# Patient Record
Sex: Female | Born: 1990 | Race: Black or African American | Hispanic: No | Marital: Single | Smoking: Former smoker
Health system: Southern US, Community
[De-identification: ages and names within clinical notes are randomized; demographics above are authoritative.]

## PROBLEM LIST (undated history)

## (undated) ENCOUNTER — Emergency Department (HOSPITAL_COMMUNITY): Admission: EM | Payer: PRIVATE HEALTH INSURANCE | Source: Home / Self Care

---
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
## 2013-12-12 ENCOUNTER — Encounter (HOSPITAL_COMMUNITY): Payer: Self-pay | Admitting: Emergency Medicine

## 2013-12-12 ENCOUNTER — Emergency Department (HOSPITAL_COMMUNITY)
Admission: EM | Admit: 2013-12-12 | Discharge: 2013-12-12 | Disposition: A | Discharge status: Home / Self Care | Payer: Self-pay | Attending: Emergency Medicine | Admitting: Emergency Medicine

## 2013-12-12 ENCOUNTER — Emergency Department (HOSPITAL_COMMUNITY): Payer: Self-pay

## 2013-12-12 DIAGNOSIS — S20219A Contusion of unspecified front wall of thorax, initial encounter: Secondary | ICD-10-CM | POA: Insufficient documentation

## 2013-12-12 DIAGNOSIS — R0789 Other chest pain: Secondary | ICD-10-CM

## 2013-12-12 DIAGNOSIS — T148XXA Other injury of unspecified body region, initial encounter: Secondary | ICD-10-CM

## 2013-12-12 DIAGNOSIS — F17200 Nicotine dependence, unspecified, uncomplicated: Secondary | ICD-10-CM | POA: Insufficient documentation

## 2013-12-12 MED ORDER — IBUPROFEN 600 MG PO TABS: 600.0000 mg | ORAL_TABLET | Freq: Four times a day (QID) | ORAL | Status: DC | PRN

## 2013-12-12 MED ORDER — HYDROCODONE-ACETAMINOPHEN 5-325 MG PO TABS: 1.0000 | ORAL_TABLET | Freq: Four times a day (QID) | ORAL | Status: DC | PRN

## 2013-12-12 MED ORDER — KETOROLAC TROMETHAMINE 60 MG/2ML IM SOLN
60.0000 mg | Freq: Once | INTRAMUSCULAR | Status: AC
  Administered 2013-12-12: 60 mg via INTRAMUSCULAR
  Filled 2013-12-12: qty 2
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
## 2013-12-12 NOTE — Discharge Instructions (Signed)
Chest Wall Pain Chest wall pain is pain felt in or around the chest bones and muscles. It may take up to 6 weeks to get better. It may take longer if you are active. Chest wall pain can happen on its own. Other times, things like germs, injury, coughing, or exercise can cause the pain. HOME CARE   Avoid activities that make you tired or cause pain. Try not to use your chest, belly (abdominal), or side muscles. Do not use heavy weights.  Put ice on the sore area.  Put ice in a plastic bag.  Place a towel between your skin and the bag.  Leave the ice on for 15-20 minutes for the first 2 days.  Only take medicine as told by your doctor. GET HELP RIGHT AWAY IF:   You have more pain or are very uncomfortable.  You have a fever.  Your chest pain gets worse.  You have new problems.  You feel sick to your stomach (nauseous) or throw up (vomit).  You start to sweat or feel lightheaded.  You have a cough with mucus (phlegm).  You cough up blood. MAKE SURE YOU:   Understand these instructions.  Will watch your condition.  Will get help right away if you are not doing well or get worse. Document Released: 01/11/2008 Document Revised: 10/17/2011 Document Reviewed: 03/21/2011 [facility] Patient Information 2014 [place], [place]. Contusion A contusion is a deep bruise. Contusions are the result of an injury that caused bleeding under the skin. The contusion may turn blue, purple, or yellow. Minor injuries will give you a painless contusion, but more severe contusions may stay painful and swollen for a few weeks.  CAUSES  A contusion is usually caused by a blow, trauma, or direct force to an area of the body. SYMPTOMS  Swelling and redness of the injured area. Bruising of the injured area. Tenderness and soreness of the injured area. Pain. DIAGNOSIS  The diagnosis can be made by taking a history and physical exam. An X-ray, CT scan, or MRI may be needed to determine if there were any  associated injuries, such as fractures. TREATMENT  Specific treatment will depend on what area of the body was injured. In general, the best treatment for a contusion is resting, icing, elevating, and applying cold compresses to the injured area. Over-the-counter medicines may also be recommended for pain control. Ask your caregiver what the best treatment is for your contusion. HOME CARE INSTRUCTIONS  Put ice on the injured area. Put ice in a plastic bag. Place a towel between your skin and the bag. Leave the ice on for 15-20 minutes, 03-04 times a day. Only take over-the-counter or prescription medicines for pain, discomfort, or fever as directed by your caregiver. Your caregiver may recommend avoiding anti-inflammatory medicines (aspirin, ibuprofen, and naproxen) for 48 hours because these medicines may increase bruising. Rest the injured area. If possible, elevate the injured area to reduce swelling. SEEK IMMEDIATE MEDICAL CARE IF:  You have increased bruising or swelling. You have pain that is getting worse. Your swelling or pain is not relieved with medicines. MAKE SURE YOU:  Understand these instructions. Will watch your condition. Will get help right away if you are not doing well or get worse. Document Released: 05/04/2005 Document Revised: 10/17/2011 Document Reviewed: 05/30/2011 [facility]Exit[facility] Patient Information 2014 [place]ExitCare, [place]LL[place].
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
## 2014-01-26 ENCOUNTER — Encounter (HOSPITAL_COMMUNITY): Payer: Self-pay | Admitting: Emergency Medicine

## 2014-01-26 ENCOUNTER — Emergency Department (HOSPITAL_COMMUNITY)
Admission: EM | Admit: 2014-01-26 | Discharge: 2014-01-26 | Disposition: A | Discharge status: Home / Self Care | Payer: Self-pay | Attending: Emergency Medicine | Admitting: Emergency Medicine

## 2014-01-26 DIAGNOSIS — F17200 Nicotine dependence, unspecified, uncomplicated: Secondary | ICD-10-CM | POA: Insufficient documentation

## 2014-01-26 DIAGNOSIS — B3731 Acute candidiasis of vulva and vagina: Secondary | ICD-10-CM | POA: Insufficient documentation

## 2014-01-26 DIAGNOSIS — N72 Inflammatory disease of cervix uteri: Secondary | ICD-10-CM | POA: Insufficient documentation

## 2014-01-26 DIAGNOSIS — B373 Candidiasis of vulva and vagina: Secondary | ICD-10-CM | POA: Insufficient documentation

## 2014-01-26 DIAGNOSIS — Z3202 Encounter for pregnancy test, result negative: Secondary | ICD-10-CM | POA: Insufficient documentation

## 2014-01-26 LAB — URINALYSIS, ROUTINE W REFLEX MICROSCOPIC
Bilirubin Urine: NEGATIVE
Glucose, UA: NEGATIVE mg/dL
Hgb urine dipstick: NEGATIVE
KETONES UR: NEGATIVE mg/dL
LEUKOCYTES UA: NEGATIVE
NITRITE: NEGATIVE
PH: 8 (ref 5.0–8.0)
Protein, ur: NEGATIVE mg/dL
Specific Gravity, Urine: 1.012 (ref 1.005–1.030)
Urobilinogen, UA: 0.2 mg/dL (ref 0.0–1.0)

## 2014-01-26 LAB — WET PREP, GENITAL: Trich, Wet Prep: NONE SEEN

## 2014-01-26 LAB — POC URINE PREG, ED: Preg Test, Ur: NEGATIVE

## 2014-01-26 MED ORDER — AZITHROMYCIN 250 MG PO TABS
1000.0000 mg | ORAL_TABLET | Freq: Once | ORAL | Status: AC
  Administered 2014-01-26: 1000 mg via ORAL
  Filled 2014-01-26: qty 4

## 2014-01-26 MED ORDER — LIDOCAINE HCL 2 % IJ SOLN
INTRAMUSCULAR | Status: AC
  Administered 2014-01-26: 30 mg
  Filled 2014-01-26: qty 20

## 2014-01-26 MED ORDER — FLUCONAZOLE 150 MG PO TABS
150.0000 mg | ORAL_TABLET | Freq: Once | ORAL | Status: AC
  Administered 2014-01-26: 150 mg via ORAL
  Filled 2014-01-26: qty 1

## 2014-01-26 MED ORDER — CEFTRIAXONE SODIUM 250 MG IJ SOLR
250.0000 mg | Freq: Once | INTRAMUSCULAR | Status: AC
  Administered 2014-01-26: 250 mg via INTRAMUSCULAR
  Filled 2014-01-26: qty 250

## 2014-01-26 NOTE — Discharge Instructions (Signed)
Candida Infection, Adult A candida infection (also called yeast, fungus and Monilia infection) is an overgrowth of yeast that can occur anywhere on the body. A yeast infection commonly occurs in warm, moist body areas. Usually, the infection remains localized but can spread to become a systemic infection. A yeast infection may be a sign of a more severe disease such as diabetes, leukemia, or AIDS. A yeast infection can occur in both men and women. In women, Candida vaginitis is a vaginal infection. It is one of the most common causes of vaginitis. Men usually do not have symptoms or know they have an infection until other problems develop. Men may find out they have a yeast infection because their sex partner has a yeast infection. Uncircumcised men are more likely to get a yeast infection than circumcised men. This is because the uncircumcised glans is not exposed to air and does not remain as dry as that of a circumcised glans. Older adults may develop yeast infections around dentures. CAUSES  Women  Antibiotics.  Steroid medication taken for a long time.  Being overweight (obese).  Diabetes.  Poor immune condition.  Certain serious medical conditions.  Immune suppressive medications for organ transplant patients.  Chemotherapy.  Pregnancy.  Menstration.  Stress and fatigue.  Intravenous drug use.  Oral contraceptives.  Wearing tight-fitting clothes in the crotch area.  Catching it from a sex partner who has a yeast infection.  Spermicide.  Intravenous, urinary, or other catheters. Men  Catching it from a sex partner who has a yeast infection.  Having oral or anal sex with a person who has the infection.  Spermicide.  Diabetes.  Antibiotics.  Poor immune system.  Medications that suppress the immune system.  Intravenous drug use.  Intravenous, urinary, or other catheters. SYMPTOMS  Women  Thick, white vaginal discharge.  Vaginal itching.  Redness and  swelling in and around the vagina.  Irritation of the lips of the vagina and perineum.  Blisters on the vaginal lips and perineum.  Painful sexual intercourse.  Low blood sugar (hypoglycemia).  Painful urination.  Bladder infections.  Intestinal problems such as constipation, indigestion, bad breath, bloating, increase in gas, diarrhea, or loose stools. Men  Men may develop intestinal problems such as constipation, indigestion, bad breath, bloating, increase in gas, diarrhea, or loose stools.  Dry, cracked skin on the penis with itching or discomfort.  Jock itch.  Dry, flaky skin.  Athlete's foot.  Hypoglycemia. DIAGNOSIS  Women  A history and an exam are performed.  The discharge may be examined under a microscope.  A culture may be taken of the discharge. Men  A history and an exam are performed.  Any discharge from the penis or areas of cracked skin will be looked at under the microscope and cultured.  Stool samples may be cultured. TREATMENT  Women  Vaginal antifungal suppositories and creams.  Medicated creams to decrease irritation and itching on the outside of the vagina.  Warm compresses to the perineal area to decrease swelling and discomfort.  Oral antifungal medications.  Medicated vaginal suppositories or cream for repeated or recurrent infections.  Wash and dry the irritation areas before applying the cream.  Eating yogurt with lactobacillus may help with prevention and treatment.  Sometimes painting the vagina with gentian violet solution may help if creams and suppositories do not work. Men  Antifungal creams and oral antifungal medications.  Sometimes treatment must continue for 30 days after the symptoms go away to prevent recurrence. HOME CARE   INSTRUCTIONS  Women  Use cotton underwear and avoid tight-fitting clothing.  Avoid colored, scented toilet paper and deodorant tampons or pads.  Do not douche.  Keep your diabetes  under control.  Finish all the prescribed medications.  Keep your skin clean and dry.  Consume milk or yogurt with lactobacillus active culture regularly. If you get frequent yeast infections and think that is what the infection is, there are over-the-counter medications that you can get. If the infection does not show healing in 3 days, talk to your caregiver.  Tell your sex partner you have a yeast infection. Your partner may need treatment also, especially if your infection does not clear up or recurs. Men  Keep your skin clean and dry.  Keep your diabetes under control.  Finish all prescribed medications.  Tell your sex partner that you have a yeast infection so they can be treated if necessary. SEEK MEDICAL CARE IF:   Your symptoms do not clear up or worsen in one week after treatment.  You have an oral temperature above 102 F (38.9 C).  You have trouble swallowing or eating for a prolonged time.  You develop blisters on and around your vagina.  You develop vaginal bleeding and it is not your menstrual period.  You develop abdominal pain.  You develop intestinal problems as mentioned above.  You get weak or lightheaded.  You have painful or increased urination.  You have pain during sexual intercourse. MAKE SURE YOU:   Understand these instructions.  Will watch your condition.  Will get help right away if you are not doing well or get worse. Document Released: 09/01/2004 Document Revised: 10/17/2011 Document Reviewed: 12/14/2009 [facility] SurgeryExitCare Patient Information 2015 [place]ExitCare, [place]LL[place]. This information is not intended to replace advice given to you by your health care provider. Make sure you discuss any questions you have with your health care provider.  Cervicitis Cervicitis is a soreness and swelling (inflammation) of the cervix. Your cervix is located at the bottom of your uterus. It opens up to the vagina. CAUSES   Sexually transmitted infections (STIs).    Allergic reaction.   Medicines or birth control devices that are put in the vagina.   Injury to the cervix.   Bacterial infections.  RISK FACTORS You are at greater risk if you:  Have unprotected sexual intercourse.  Have sexual intercourse with many partners.  Began sexual intercourse at an early age.  Have a history of STIs. SYMPTOMS  There may be no symptoms. If symptoms occur, they may include:   Gray, white, yellow, or bad-smelling vaginal discharge.   Pain or itching of the area outside the vagina.   Painful sexual intercourse.   Lower abdominal or lower back pain, especially during intercourse.   Frequent urination.   Abnormal vaginal bleeding between periods, after sexual intercourse, or after menopause.   Pressure or a heavy feeling in the pelvis.  DIAGNOSIS  Diagnosis is made after a pelvic exam. Other tests may include:   Examination of any discharge under a microscope (wet prep).   A Pap test.  TREATMENT  Treatment will depend on the cause of cervicitis. If it is caused by an STI, both you and your partner will need to be treated. Antibiotic medicines will be given.  HOME CARE INSTRUCTIONS   Do not have sexual intercourse until your health care provider says it is okay.   Do not have sexual intercourse until your partner has been treated, if your cervicitis is caused by an  STI.   Take your antibiotics as directed. Finish them even if you start to feel better.  SEEK MEDICAL CARE IF:  Your symptoms come back.   You have a fever.  MAKE SURE YOU:   Understand these instructions.  Will watch your condition.  Will get help right away if you are not doing well or get worse. Document Released: 07/25/2005 Document Revised: 07/30/2013 Document Reviewed: 01/16/2013 [facility] Patient Information 2015 [place]ExitCare, [place]LL[place]. This information is not intended to replace advice given to you by your health care provider. Make sure you discuss any  questions you have with your health care provider.

## 2014-01-26 NOTE — ED Notes (Signed)
Pain and burning sensationon on urination, x 3 days. "urine looks pink"

## 2014-01-26 NOTE — ED Notes (Signed)
Bed: WA06 Expected date:  Expected time:  Means of arrival:  Comments: Block for triage 6 pelvic
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
## 2014-01-26 NOTE — ED Provider Notes (Signed)
Medical screening examination/treatment/procedure(s) were performed by non-physician practitioner and as supervising physician I was immediately available for consultation/collaboration.   EKG Interpretation None        Kristen N Ward, DO 01/26/14 1355 

## 2014-01-27 LAB — GC/CHLAMYDIA PROBE AMP
CT Probe RNA: NEGATIVE
GC PROBE AMP APTIMA: NEGATIVE

## 2014-09-05 ENCOUNTER — Emergency Department (HOSPITAL_COMMUNITY)
Admission: EM | Admit: 2014-09-05 | Discharge: 2014-09-05 | Discharge status: Against Medical Advice | Payer: Self-pay | Attending: Emergency Medicine | Admitting: Emergency Medicine

## 2014-09-05 ENCOUNTER — Emergency Department (HOSPITAL_COMMUNITY)
Admission: EM | Admit: 2014-09-05 | Discharge: 2014-09-05 | Disposition: A | Discharge status: Home / Self Care | Payer: Self-pay | Attending: Emergency Medicine | Admitting: Emergency Medicine

## 2014-09-05 ENCOUNTER — Emergency Department (HOSPITAL_COMMUNITY): Payer: Self-pay

## 2014-09-05 ENCOUNTER — Encounter (HOSPITAL_COMMUNITY): Payer: Self-pay | Admitting: Emergency Medicine

## 2014-09-05 ENCOUNTER — Encounter (HOSPITAL_COMMUNITY): Payer: Self-pay

## 2014-09-05 DIAGNOSIS — R0989 Other specified symptoms and signs involving the circulatory and respiratory systems: Secondary | ICD-10-CM | POA: Insufficient documentation

## 2014-09-05 DIAGNOSIS — S128XXA Fracture of other parts of neck, initial encounter: Secondary | ICD-10-CM | POA: Insufficient documentation

## 2014-09-05 DIAGNOSIS — Y9389 Activity, other specified: Secondary | ICD-10-CM | POA: Insufficient documentation

## 2014-09-05 DIAGNOSIS — Z720 Tobacco use: Secondary | ICD-10-CM | POA: Insufficient documentation

## 2014-09-05 DIAGNOSIS — Y998 Other external cause status: Secondary | ICD-10-CM | POA: Insufficient documentation

## 2014-09-05 DIAGNOSIS — Y939 Activity, unspecified: Secondary | ICD-10-CM | POA: Insufficient documentation

## 2014-09-05 DIAGNOSIS — R070 Pain in throat: Secondary | ICD-10-CM | POA: Insufficient documentation

## 2014-09-05 DIAGNOSIS — X58XXXA Exposure to other specified factors, initial encounter: Secondary | ICD-10-CM | POA: Insufficient documentation

## 2014-09-05 DIAGNOSIS — Y929 Unspecified place or not applicable: Secondary | ICD-10-CM | POA: Insufficient documentation

## 2014-09-05 DIAGNOSIS — Y9289 Other specified places as the place of occurrence of the external cause: Secondary | ICD-10-CM | POA: Insufficient documentation

## 2014-09-05 MED ORDER — HYDROCODONE-ACETAMINOPHEN 5-325 MG PO TABS: ORAL_TABLET | ORAL | Status: DC

## 2014-09-05 MED ORDER — NAPROXEN 500 MG PO TABS: 500.0000 mg | ORAL_TABLET | Freq: Two times a day (BID) | ORAL | Status: DC
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
## 2014-09-05 NOTE — ED Notes (Signed)
Called Xray for ETA for neck xray; pt is next in line

## 2014-09-05 NOTE — ED Notes (Signed)
Pt states she thought she would be in/out in 1hr ; pt states she has to take sister to school and can not stay for further evaluation; Pt left AMA
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
## 2015-03-12 ENCOUNTER — Ambulatory Visit: Payer: Self-pay

## 2015-05-28 ENCOUNTER — Emergency Department (HOSPITAL_COMMUNITY)
Admission: EM | Admit: 2015-05-28 | Discharge: 2015-05-28 | Disposition: A | Discharge status: Home / Self Care | Payer: Self-pay | Attending: Emergency Medicine | Admitting: Emergency Medicine

## 2015-05-28 ENCOUNTER — Encounter (HOSPITAL_COMMUNITY): Payer: Self-pay | Admitting: Emergency Medicine

## 2015-05-28 DIAGNOSIS — S0591XA Unspecified injury of right eye and orbit, initial encounter: Secondary | ICD-10-CM | POA: Insufficient documentation

## 2015-05-28 DIAGNOSIS — Y9367 Activity, basketball: Secondary | ICD-10-CM | POA: Insufficient documentation

## 2015-05-28 DIAGNOSIS — Z791 Long term (current) use of non-steroidal anti-inflammatories (NSAID): Secondary | ICD-10-CM | POA: Insufficient documentation

## 2015-05-28 DIAGNOSIS — W2105XA Struck by basketball, initial encounter: Secondary | ICD-10-CM | POA: Insufficient documentation

## 2015-05-28 DIAGNOSIS — Y92009 Unspecified place in unspecified non-institutional (private) residence as the place of occurrence of the external cause: Secondary | ICD-10-CM | POA: Insufficient documentation

## 2015-05-28 DIAGNOSIS — Z720 Tobacco use: Secondary | ICD-10-CM | POA: Insufficient documentation

## 2015-05-28 DIAGNOSIS — Y998 Other external cause status: Secondary | ICD-10-CM | POA: Insufficient documentation

## 2015-05-28 MED ORDER — TETRACAINE HCL 0.5 % OP SOLN
2.0000 [drp] | Freq: Once | OPHTHALMIC | Status: AC
  Administered 2015-05-28: 2 [drp] via OPHTHALMIC
  Filled 2015-05-28: qty 2

## 2015-05-28 MED ORDER — FLUORESCEIN SODIUM 1 MG OP STRP
1.0000 | ORAL_STRIP | Freq: Once | OPHTHALMIC | Status: DC
  Filled 2015-05-28: qty 1

## 2015-05-28 NOTE — Discharge Instructions (Signed)
You may use ibuprofen as prescribed over-the-counter for pain relief.  Follow-up with ophthalmology within the next week. Return to the emergency department if symptoms worsen or new onset of visual changes, fever, eye drainage, swelling, decreased eye movement.   Emergency Department Resource Guide 1) Find a Doctor and Pay Out of Pocket Although you won't have to find out who is covered by your insurance plan, it is a good idea to ask around and get recommendations. You will then need to call the office and see if the doctor you have chosen will accept you as a new patient and what types of options they offer for patients who are self-pay. Some doctors offer discounts or will set up payment plans for their patients who do not have insurance, but you will need to ask so you aren't surprised when you get to your appointment.  2) Contact Your Local Health Department Not all health departments have doctors that can see patients for sick visits, but many do, so it is worth a call to see if yours does. If you don't know where your local health department is, you can check in your phone book. The CDC also has a tool to help you locate your state's health department, and many state websites also have listings of all of their local health departments.  3) Find a Walk-in Clinic If your illness is not likely to be very severe or complicated, you may want to try a walk in clinic. These are popping up all over the country in pharmacies, drugstores, and shopping centers. They're usually staffed by nurse practitioners or physician assistants that have been trained to treat common illnesses and complaints. They're usually fairly quick and inexpensive. However, if you have serious medical issues or chronic medical problems, these are probably not your best option.  No Primary Care Doctor: - Call Health Connect at  [phone] - they can help you locate a primary care doctor that  accepts your insurance, provides  certain services, etc. - Physician Referral Service- [phone]  Chronic Pain Problems: Organization         Address  Phone   Notes  [facility]  [phone] Patients need to be referred by their primary care doctor.   Medication Assistance: Organization         Address  Phone   Notes  [facility] [address] [place], [place] [address] [phone] --Must be a resident of [facility] -- Must have NO insurance coverage whatsoever (no Medicaid/ Medicare, etc.) -- The pt. MUST have a primary care doctor that directs their care regularly and follows them in the community   [facility]  [phone]   [employer]  [phone]    Agencies that provide inexpensive medical care: Organization         Address  Phone   Notes  [facility]  [phone]   [facility]    [phone]   [facility] [address] [place], [place] [address] [phone]   [facility] [address] [place] [place] [phone]   Planned Parenthood    [phone]   [facility]    [phone]   [facility]  [address], [place] Phone:  [phone], Fax:  [phone] Hours of Operation:  9 am - 6 pm, M-F.  Also accepts Medicaid/Medicare and self-pay.  [facility]  [address], [place] Phone: [phone], Fax: [phone]. Hours of Operation:  8:30 am - 5:30 pm, M-F.  Also accepts Medicaid and self-pay.  [facility] [place] [address] [place] Phone: [phone]   [facility] [address] Natasha Bence [place], [place] [phone], Ext. [phone] Mondays & Thursdays: 7-9 AM.  First 15 patients are seen on a first come, first serve basis.    Medicaid-accepting [facility] Providers:  Organization         Address  Phone   Notes  [facility] [address], [place] [phone] Also accepts self-pay patients.  [facility] [address] Laurell Josephs [place], [place]  [phone]   [facility] [address], [place] [phone]   [facility] [address] [place] [phone]   Renaye Rakers [address], [place]   [phone] Only accepts [place] patients after they have their name applied to their card.   Self-Pay (no insurance) in [facility]:  Organization         Address  Phone   Notes  Sickle Cell Patients, [facility] [address] [place], [place] [phone]   [facility] [address] [place], [place] [phone]   [facility] [place]  [address],  [phone]   [facility]/Dr. Osei-Bonsu  [address] [place] or [address], [place] [phone] Phone number for both [place] and [place] locations is the same.  [facility] [address], [place] [phone]   [facility] [address], [place] or [address] [phone]   [facility] [address], [place] [phone], phone; [phone], fax Sees patients 1st and 3rd Saturday of every month.  Must not qualify for public or private insurance (i.e. Medicaid, Medicare, [place] Health Choice, Veterans' Benefits)  Household income should be no more than 200% of the poverty level The clinic cannot treat you if you are pregnant or think you are pregnant  Sexually transmitted diseases are not treated at the clinic.    Dental Care: Organization         Address  Phone  Notes  [facility] Department of [facility] [address] [place], [place] [phone]  Accepts children up to age 3 who are enrolled in [place] or [place] Health Choice; pregnant women with a Medicaid card; and children who have applied for Medicaid or [place] Health Choice, but were declined, whose parents can pay a reduced fee at time of service.  [facility] Department of [facility]  [address], [place] [phone] Accepts children up to age 75 who are enrolled in [place] or [place] Health Choice; pregnant women with a Medicaid card; and children who have applied for Medicaid or [place] Health Choice, but were declined, whose parents can pay a reduced fee at time of service.  [facility]  [address] [place], [place] [phone] Patients are seen by appointment only. Walk-ins are not accepted. [facility] will see patients 16 years of age and older. Monday - Tuesday (8am-5pm) Most Wednesdays (8:30-5pm) $30 per  visit, cash only  [facility]  [address], [facility] [phone] Patients are seen by appointment only. Walk-ins are not accepted. [facility] will see patients 26 years of age and older. One Wednesday Evening (Monthly: Volunteer Based).  $30 per visit, cash only  [facility]  [phone] for adults; Children under age 84, call Graduate Pediatric Dentistry at [phone]. Children aged 24-14, please call [phone] to request a pediatric application.  Dental services are provided in all areas of dental care including fillings, crowns and bridges, complete and partial dentures, implants, gum treatment, root canals, and extractions. Preventive care is also provided. Treatment is provided to both adults and children. Patients are selected via a lottery and there is often a waiting list.   [facility] [address], [place]  [phone] [email or web address]   [facility] [address], [place], [place] [phone], Ext. [phone] Second  and Fourth Thursday of each month, opens at 6:30 AM; Clinic ends at 9 AM.  Patients are seen on a first-come first-served basis, and a limited number are seen during each clinic.   [facility]  [address], [place], [place] [phone]   Eligibility Requirements You must have lived in [place], [place], or [place] counties for at least the last three months.   You cannot be eligible for state or federal sponsored National Cityhealthcare insurance, including CIGNAVeterans Administration, [place]icaid, or [employer]icare.   You generally cannot be eligible for healthcare insurance through your employer.    How to apply: Eligibility screenings are held every Tuesday and Wednesday afternoon from 1:00 pm until 4:00 pm. You do not need an appointment for the interview!  [facility] [address], [place], [place] [phone]   [facility]  [phone]   [facility]  [phone]   [facility]  [phone]    Behavioral Health Resources in the Community: Intensive Outpatient Programs Organization         Address  Phone  Notes  [facility] [address], [place], [place] [phone]   [facility] [address], [place], [place] [phone]   [facility] [address], [place], [place]  [phone]   [facility] [address],  [place], [place] [phone] or [phone]   Substance Abuse Resources Organization         Address  Phone  Notes  [facility]  [phone]   [facility]  [phone]   [facility]  [phone]   [facility]  [phone]   Residential & Outpatient Substance Abuse Program  [phone]   Psychological Services Organization         Address  Phone  Notes  [facility]  [phone]   [facility]  [phone]- [phone]   [facility] [address], [place] [phone] or [phone]    Mobile Crisis Teams Organization         Address  Phone  Notes  [facility], Mobile Crisis Care Unit  [phone]   [facility]  [address] [place], [place] [phone]   Doristine LocksSharon DeEsch [address] [place] [phone]    Self-Help/Support Groups Organization         Address  Phone  Notes  [facility] [place] - variety of support groups  336- [phone] Call for more information  Narcotics Anonymous (NA), [facility] [address], [place]  2 meetings at this location   Statisticianesidential Treatment Programs Organization         Address  Phone  Notes  [facility] [address],    [place]  [phone]   [facility]  [address], [place] [phone], [place], [place] [phone]   [facility] [address], [place] [phone] Admissions: 8am-3pm M-F  [facility] [address]    [place], [place] [phone]   [facility] [address], [place], [place] [phone]   [facility] [address]  [place], [place] [phone]   [facility] - Intensive Outpatient [address], [place], [place] [phone]   [facility] ([facility] [address]  [place], [place] [phone] or [phone]   [facility] ([facility]) [address] [place], [place] [phone] Accepts Medicaid  [facility] [address]  [place] [phone] Substance Abuse/Addiction Treatment   [facility] Resources Organization         Address  Phone  Notes  [facility]  [phone]   Angie FavaJulie Brannon, PhD [address], [address] [place], [place]   [phone] or [phone]   [facility]   [address] [place], [place]  [phone]   [facility] [address], [place], [place] [phone] Insurance/Medicaid/sponsorship through [facility]  [facility] Families [address]                                    [place], [place] [phone] Therapy/tele-psych/case  [facility] [address]   [place], [place] [phone]    Dr. Lolly MustacheArfeen  [phone]   [facility]  [facility] 1) [address], [place] 2) [address], [place] 3)  [address], [place] [phone]  [phone]   [facility] Child Abuse Hotline [phone]) [phone] or [phone] (After Hours)

## 2015-05-28 NOTE — ED Provider Notes (Signed)
CSN: [id number]     Arrival date & time 05/28/15  1245 History  By signing my name below, I, Carla Banks, attest that this documentation has been prepared under the direction and in the presence of Melburn HakeNicole Lawayne Hartig, PA-C Electronically Signed: Soijett Banks, ED Scribe. 05/28/2015. 1:36 PM.   Chief Complaint  Patient presents with  . Eye Pain     The history is provided by the patient. No language interpreter was used.    HPI Comments: Carla Banks is a 24 y.o. female who presents to the Emergency Department complaining of progressively worsening right upper eye pain onset 2 days. She reports that she was playing basketball when she got hit in the right eye with another persons hand while trying to shoot the ball. She denies there being any pain initially until she went outside in the sun yesterday and her eye became sensitive to light. She denies wearing glasses or contacts. She states that she is having associated symptoms of photophobia, intermittent mild blurred vision "black spots", eye redness x yesterday, and mild watery eyes. She states that she has tried visine with no relief for her symptoms. She denies eye discharge, right eye swelling, double vision, and any other symptoms. Denies medical issues.      History reviewed. No pertinent past medical history. History reviewed. No pertinent past surgical history. Family History  Problem Relation Age of Onset  . Diabetes Other    Social History  Substance Use Topics  . Smoking status: Current Some Day Smoker    Types: Cigarettes, Cigars  . Smokeless tobacco: None  . Alcohol Use: Yes   OB History    No data available     Review of Systems  Eyes: Positive for photophobia, pain, redness and visual disturbance (mild blurred). Negative for discharge.       Watery right eye  Skin: Negative for color change and wound.      Allergies  Review of patient's allergies indicates no known allergies.  Home Medications   Prior to  Admission medications   Medication Sig Start Date End Date Taking? Authorizing Provider  acetaminophen (TYLENOL) 325 MG tablet Take 650 mg by mouth every 6 (six) hours as needed for fever.    Historical Provider, MD  HYDROcodone-acetaminophen (NORCO/VICODIN) 5-325 MG per tablet Take 1-2 tablets every 6 hours as needed for severe pain 09/05/14   Renne CriglerJoshua Geiple, PA-C  naproxen (NAPROSYN) 500 MG tablet Take 1 tablet (500 mg total) by mouth 2 (two) times daily. 09/05/14   Renne CriglerJoshua Geiple, PA-C   BP 114/67 mmHg  Pulse 65  Temp(Src) 97.7 F (36.5 C) (Oral)  Resp 17  SpO2 100%  LMP 04/25/2015 Physical Exam  Constitutional: She is oriented to person, place, and time. She appears well-developed and well-nourished. No distress.  HENT:  Head: Normocephalic and atraumatic.  Eyes: EOM and lids are normal. Pupils are equal, round, and reactive to light. Right eye exhibits no chemosis, no discharge, no exudate and no hordeolum. No foreign body present in the right eye. Left eye exhibits no chemosis, no discharge, no exudate and no hordeolum. No foreign body present in the left eye. Right conjunctiva is injected. Right conjunctiva has no hemorrhage. Left conjunctiva is not injected. Left conjunctiva has no hemorrhage. Right eye exhibits no nystagmus. Left eye exhibits no nystagmus.  Slit lamp exam:      The right eye shows no corneal abrasion, no corneal ulcer, no foreign body, no hyphema, no fluorescein uptake and no anterior chamber  bulge.  tonopen pressure is 11.   Neck: Neck supple.  Cardiovascular: Normal rate.   Pulmonary/Chest: Effort normal. No respiratory distress.  Musculoskeletal: Normal range of motion.  Neurological: She is alert and oriented to person, place, and time.  Skin: Skin is warm and dry.  Psychiatric: She has a normal mood and affect. Her behavior is normal.  Nursing note and vitals reviewed.   ED Course  Procedures (including critical care time) DIAGNOSTIC STUDIES: Oxygen  Saturation is 100% on RA, nl by my interpretation.    COORDINATION OF CARE: 1:27 PM Discussed treatment plan with pt at bedside which includes visual acuity and pt agreed to plan.  2:04 PM- US performed by Fayrene Helper PA-C revealed right lens intact, No retinal detachment.   Labs Review Labs Reviewed - No data to display  Imaging Review No results found.   Filed Vitals:   05/28/15 1259  BP: 114/67  Pulse: 65  Temp: 97.7 F (36.5 C)  Resp: 17     MDM   Final diagnoses:  Eye injury, right, initial encounter    Patient presents with right eye pain and light sensitivity that has worsened over the past day. Reports being hit while playing basketball 2 days ago. Endorses mild vision changes "intermittent black spots". Denies use of contacts or glasses. VSS. Exam revealed injection of right conjunctiva, EOMI, PERRL. No visual deficits at this time. No fluorescein uptake, Wood's lamp exam unremarkable. Tono-Pen revealed IOP of 11. Ultrasound performed by Fayrene Helper, PA-C revealed right lens intact, no retinal detachment. I do not suspect a corneal abrasion, retinal detachment at this time. I do not feel that any further workup or imaging is warranted at this time. Plan to discharge patient home. Patient given ophthalmology follow-up.  Evaluation does not show pathology requring ongoing emergent intervention or admission. Pt is hemodynamically stable and mentating appropriately. Discussed findings/results and plan with patient/guardian, who agrees with plan. All questions answered. Return precautions discussed and outpatient follow up given.    I personally performed the services described in this documentation, which was scribed in my presence. The recorded information has been reviewed and is accurate.    Carla Banks, [place] 05/28/15 2133  Gilda Crease, MD 05/29/15 [phone]

## 2015-05-28 NOTE — ED Notes (Signed)
Pt c/o right eye pain and light sensitivity x 2 days. Pt states that she was at her cousins house playing basketball and got hit in the eye two days ago.  Pt states that has little blurred vision.  Pt denies wearing any glasses or contacts.

## 2016-01-10 ENCOUNTER — Encounter (HOSPITAL_COMMUNITY): Payer: Self-pay | Admitting: *Deleted

## 2016-01-10 ENCOUNTER — Ambulatory Visit (HOSPITAL_COMMUNITY)
Admission: EM | Admit: 2016-01-10 | Discharge: 2016-01-10 | Disposition: A | Discharge status: Home / Self Care | Payer: Self-pay | Attending: Family Medicine | Admitting: Family Medicine

## 2016-01-10 DIAGNOSIS — K0889 Other specified disorders of teeth and supporting structures: Secondary | ICD-10-CM

## 2016-01-10 MED ORDER — DICLOFENAC POTASSIUM 50 MG PO TABS: 50.0000 mg | ORAL_TABLET | Freq: Three times a day (TID) | ORAL | Status: DC

## 2016-01-10 NOTE — ED Notes (Signed)
Pt  Reports  Toothache     Both   Sides      r   Bottom  Is    Worse     Symptoms         Pt  Reports    Pain  Is  Getting  Worse    Over last  sev    Weeks      She  Reports she  Has  Not  Seen a  Dentist  Yet

## 2016-01-10 NOTE — Discharge Instructions (Signed)
Take medicine as prescribed, see your dentist as soon as possible °

## 2016-01-10 NOTE — ED Provider Notes (Signed)
CSN: [id number]     Arrival date & time 01/10/16  1604 History   First MD Initiated Contact with Patient 01/10/16 1716     Chief Complaint  Patient presents with  . Dental Pain   (Consider location/radiation/quality/duration/timing/severity/associated sxs/prior Treatment) Patient is a 25 y.o. female presenting with tooth pain. The history is provided by the patient.  Dental Pain Location:  Lower Lower teeth location:  32/RL 3rd molar and 17/LL 3rd molar Quality:  Throbbing Severity:  Moderate Onset quality:  Gradual Duration:  3 weeks Chronicity:  New Context: not abscess, not dental caries, not dental fracture and normal dentition   Associated symptoms: no fever, no oral lesions and no trismus   Risk factors: lack of dental care     History reviewed. No pertinent past medical history. History reviewed. No pertinent past surgical history. Family History  Problem Relation Age of Onset  . Diabetes Other    Social History  Substance Use Topics  . Smoking status: Current Some Day Smoker    Types: Cigarettes, Cigars  . Smokeless tobacco: None  . Alcohol Use: Yes   OB History    No data available     Review of Systems  Constitutional: Negative for fever.  HENT: Positive for dental problem. Negative for mouth sores.   All other systems reviewed and are negative.   Allergies  Review of patient's allergies indicates no known allergies.  Home Medications   Prior to Admission medications   Medication Sig Start Date End Date Taking? Authorizing Provider  acetaminophen (TYLENOL) 325 MG tablet Take 650 mg by mouth every 6 (six) hours as needed for fever.    Historical Provider, MD  diclofenac (CATAFLAM) 50 MG tablet Take 1 tablet (50 mg total) by mouth 3 (three) times daily. For dental pain 01/10/16   Linna HoffJames D Antonia Culbertson, MD  HYDROcodone-acetaminophen (NORCO/VICODIN) 5-325 MG per tablet Take 1-2 tablets every 6 hours as needed for severe pain 09/05/14   Renne CriglerJoshua Geiple, PA-C  naproxen  (NAPROSYN) 500 MG tablet Take 1 tablet (500 mg total) by mouth 2 (two) times daily. 09/05/14   Renne CriglerJoshua Geiple, PA-C   Meds Ordered and Administered this Visit  Medications - No data to display  BP 124/78 mmHg  Pulse 78  Temp(Src) 98.6 F (37 C) (Oral)  Resp 16  SpO2 100%  LMP 12/21/2015 No data found.   Physical Exam  Constitutional: She appears well-developed and well-nourished.  HENT:  Right Ear: External ear normal.  Left Ear: External ear normal.  Mouth/Throat: Oropharynx is clear and moist.    Neck: Normal range of motion. Neck supple.  Lymphadenopathy:    She has no cervical adenopathy.  Skin: Skin is warm and dry.  Nursing note and vitals reviewed.   ED Course  Procedures (including critical care time)  Labs Review Labs Reviewed - No data to display  Imaging Review No results found.   Visual Acuity Review  Right Eye Distance:   Left Eye Distance:   Bilateral Distance:    Right Eye Near:   Left Eye Near:    Bilateral Near:         MDM   1. Pain, dental       Linna HoffJames D Layana Konkel, MD 01/10/16 [phone]

## 2016-01-28 ENCOUNTER — Emergency Department (HOSPITAL_COMMUNITY): Payer: Self-pay

## 2016-01-28 ENCOUNTER — Emergency Department (HOSPITAL_COMMUNITY)
Admission: EM | Admit: 2016-01-28 | Discharge: 2016-01-28 | Disposition: A | Discharge status: Home / Self Care | Payer: Self-pay | Attending: Emergency Medicine | Admitting: Emergency Medicine

## 2016-01-28 ENCOUNTER — Encounter (HOSPITAL_COMMUNITY): Payer: Self-pay

## 2016-01-28 DIAGNOSIS — R059 Cough, unspecified: Secondary | ICD-10-CM

## 2016-01-28 DIAGNOSIS — Z87891 Personal history of nicotine dependence: Secondary | ICD-10-CM | POA: Insufficient documentation

## 2016-01-28 DIAGNOSIS — R05 Cough: Secondary | ICD-10-CM | POA: Insufficient documentation

## 2016-01-28 MED ORDER — CETIRIZINE HCL 10 MG PO CAPS
10.0000 mg | ORAL_CAPSULE | Freq: Every day | ORAL | Status: DC
Start: 2016-01-28 — End: 2018-06-15

## 2016-01-28 MED ORDER — BENZONATATE 100 MG PO CAPS: 100.0000 mg | ORAL_CAPSULE | Freq: Three times a day (TID) | ORAL | Status: DC | PRN

## 2016-01-28 NOTE — ED Provider Notes (Signed)
CSN: [id number]     Arrival date & time 01/28/16  0818 History   First MD Initiated Contact with Patient 01/28/16 [phone]     Chief Complaint  Patient presents with  . Cough  . Otalgia     (Consider location/radiation/quality/duration/timing/severity/associated sxs/prior Treatment) HPI  25 year old female presents with a cough since the end of last month. Had a couple days of no cough and then for the last 3 weeks has had continuous cough. Mostly is dry with occasional yellow phlegm. No fevers. No shortness of breath. A few days ago she had one episode of posttussive emesis. Patient states her throat feels a little sore from coughing. She also is having bilateral, right greater than left, ear pain over the last few days. It feels like an aching sensation. Has tried equate and Mucinex with no relief.  History reviewed. No pertinent past medical history. History reviewed. No pertinent past surgical history. Family History  Problem Relation Age of Onset  . Diabetes Other    Social History  Substance Use Topics  . Smoking status: Former Smoker    Types: Cigarettes, Cigars  . Smokeless tobacco: None  . Alcohol Use: Yes   OB History    No data available     Review of Systems  Constitutional: Negative for fever.  HENT: Positive for congestion, ear pain and sore throat.   Respiratory: Positive for cough. Negative for shortness of breath.   Gastrointestinal: Positive for vomiting. Negative for nausea and abdominal pain.  All other systems reviewed and are negative.     Allergies  Review of patient's allergies indicates no known allergies.  Home Medications   Prior to Admission medications   Medication Sig Start Date End Date Taking? Authorizing Provider  acidophilus (RISAQUAD) CAPS capsule Take 1 capsule by mouth daily.   Yes Historical Provider, MD  diphenhydrAMINE (BENADRYL) 25 mg capsule Take 50 mg by mouth every 6 (six) hours as needed for allergies.   Yes Historical  Provider, MD  Phenylephrine-DM-GG-APAP [facility]) 5-10-200-325 MG/10ML LIQD Take 20 mLs by mouth daily as needed (for cold).   Yes Historical Provider, MD  diclofenac (CATAFLAM) 50 MG tablet Take 1 tablet (50 mg total) by mouth 3 (three) times daily. For dental pain Patient not taking: Reported on 01/28/2016 01/10/16   Linna HoffJames D Kindl, MD   BP 127/74 mmHg  Pulse 64  Temp(Src) 98.8 F (37.1 C) (Oral)  Resp 14  SpO2 100%  LMP 12/21/2015 Physical Exam  Constitutional: She is oriented to person, place, and time. She appears well-developed and well-nourished.  HENT:  Head: Normocephalic and atraumatic.  Right Ear: Tympanic membrane, external ear and ear canal normal.  Left Ear: Tympanic membrane, external ear and ear canal normal.  Nose: Nose normal.  Mouth/Throat: Oropharynx is clear and moist. No oropharyngeal exudate.  Eyes: Right eye exhibits no discharge. Left eye exhibits no discharge.  Neck: Neck supple.  Cardiovascular: Normal rate, regular rhythm and normal heart sounds.   Pulmonary/Chest: Effort normal and breath sounds normal. She has no wheezes. She has no rales.  Abdominal: Soft. There is no tenderness.  Neurological: She is alert and oriented to person, place, and time.  Skin: Skin is warm and dry.  Nursing note and vitals reviewed.   ED Course  Procedures (including critical care time) Labs Review Labs Reviewed - No data to display  Imaging Review Dg Chest 2 View  01/28/2016  CLINICAL DATA:  Cough for 3 weeks EXAM: CHEST  2 VIEW COMPARISON:  12/12/2013 FINDINGS: Cardiomediastinal silhouette is stable. No acute infiltrate or pleural effusion. No pulmonary edema. Bony thorax is unremarkable. IMPRESSION: No active cardiopulmonary disease. Electronically Signed   By: Natasha MeadLiviu  Pop M.D.   On: 01/28/2016 09:31   I have personally reviewed and evaluated these images and lab results as part of my medical decision-making.   EKG Interpretation None      MDM    Final diagnoses:  Cough    Chest x-ray is normal, no pneumonia. Overall patient appears well. No increased work of breathing or shortness of breath. Possible URI versus allergies. Will treat for both. Recommend follow-up with PCP to set up primary care and follow-up. Discussed return precautions.    Pricilla LovelessScott Amaree Leeper, MD 01/28/16 1027

## 2016-01-28 NOTE — ED Notes (Signed)
Pt c/o cough "since the end of May" and bilateral ear pain x "a couple days."  Pain score 5/10.  Pt reports taking OTC medication w/o relief.  Pt reports "a couple days ago, I threw up from coughing."  Dry cough noted during assessment.

## 2016-04-04 ENCOUNTER — Encounter: Payer: Self-pay | Admitting: Family Medicine

## 2016-04-04 ENCOUNTER — Ambulatory Visit (INDEPENDENT_AMBULATORY_CARE_PROVIDER_SITE_OTHER): Payer: No Typology Code available for payment source | Admitting: Family Medicine

## 2016-04-04 VITALS — BP 134/87 | HR 61 | Temp 98.4°F | Ht 64.0 in | Wt 138.0 lb

## 2016-04-04 DIAGNOSIS — Z0000 Encounter for general adult medical examination without abnormal findings: Secondary | ICD-10-CM

## 2016-04-04 DIAGNOSIS — K0889 Other specified disorders of teeth and supporting structures: Secondary | ICD-10-CM

## 2016-04-04 LAB — CBC WITH DIFFERENTIAL/PLATELET
BASOS PCT: 0 %
Basophils Absolute: 0 cells/uL (ref 0–200)
Eosinophils Absolute: 0 cells/uL — ABNORMAL LOW (ref 15–500)
Eosinophils Relative: 0 %
HEMATOCRIT: 45 % (ref 35.0–45.0)
Hemoglobin: 15.3 g/dL (ref 11.7–15.5)
Lymphocytes Relative: 43 %
Lymphs Abs: 1591 cells/uL (ref 850–3900)
MCH: 31.9 pg (ref 27.0–33.0)
MCHC: 34 g/dL (ref 32.0–36.0)
MCV: 93.9 fL (ref 80.0–100.0)
MONO ABS: 296 {cells}/uL (ref 200–950)
MPV: 11.7 fL (ref 7.5–12.5)
Monocytes Relative: 8 %
NEUTROS ABS: 1813 {cells}/uL (ref 1500–7800)
Neutrophils Relative %: 49 %
Platelets: 223 10*3/uL (ref 140–400)
RBC: 4.79 MIL/uL (ref 3.80–5.10)
RDW: 13.6 % (ref 11.0–15.0)
WBC: 3.7 10*3/uL — ABNORMAL LOW (ref 3.8–10.8)

## 2016-04-04 LAB — COMPLETE METABOLIC PANEL WITH GFR
ALT: 12 U/L (ref 6–29)
AST: 15 U/L (ref 10–30)
Albumin: 4.1 g/dL (ref 3.6–5.1)
Alkaline Phosphatase: 32 U/L — ABNORMAL LOW (ref 33–115)
BUN: 16 mg/dL (ref 7–25)
CALCIUM: 9.4 mg/dL (ref 8.6–10.2)
CHLORIDE: 107 mmol/L (ref 98–110)
CO2: 22 mmol/L (ref 20–31)
Creat: 0.87 mg/dL (ref 0.50–1.10)
GFR, Est Non African American: >89 mL/min (ref >=60)
Glucose, Bld: 81 mg/dL (ref 65–99)
POTASSIUM: 4.1 mmol/L (ref 3.5–5.3)
Sodium: 139 mmol/L (ref 135–146)
Total Bilirubin: 0.6 mg/dL (ref 0.2–1.2)
Total Protein: 6.6 g/dL (ref 6.1–8.1)

## 2016-04-04 NOTE — Progress Notes (Signed)
Carla Carla Banks, is a 25 y.o. female  ZOX:[id number]  [id number]  DOB - [birth year]  CC:  Chief Complaint  Patient presents with  . New Patient (Initial Visit)    needs dental referral for pain in wisdom teeth area , would like to discuss primary care issues and care       HPI: Carla Banks is a 25 y.o. female here to establish care. She reports being healthy and has no chronic illnesses. Her major reason for visit is to get a referral to dentist with her orange card.   Health Maintenance: Reports Tda[ in 2013. Needs A1C for diabetes screening. Has been screened for HIV in past with negative results. She reports a pap in 2013. She does not smoke, uses alcohol rarely and does not use drugs.   No Known Allergies History reviewed. No pertinent past medical history. Current Outpatient Prescriptions on File Prior to Visit  Medication Sig Dispense Refill  . acidophilus (RISAQUAD) CAPS capsule Take 1 capsule by mouth daily.    . benzonatate (TESSALON) 100 MG capsule Take 1 capsule (100 mg total) by mouth 3 (three) times daily as needed for cough. (Patient not taking: Reported on 04/04/2016) 21 capsule 0  . Cetirizine HCl 10 MG CAPS Take 1 capsule (10 mg total) by mouth daily. (Patient not taking: Reported on 04/04/2016) 30 capsule 0  . diclofenac (CATAFLAM) 50 MG tablet Take 1 tablet (50 mg total) by mouth 3 (three) times daily. For dental pain (Patient not taking: Reported on 01/28/2016) 30 tablet 0  . diphenhydrAMINE (BENADRYL) 25 mg capsule Take 50 mg by mouth every 6 (six) hours as needed for allergies.    . Phenylephrine-DM-GG-APAP (MUCINEX COLD [facility] THROAT CHILD) 5-10-200-325 MG/10ML LIQD Take 20 mLs by mouth daily as needed (for cold).     No current facility-administered medications on file prior to visit.    Family History  Problem Relation Age of Onset  . Diabetes Other    Social History   Social History  . Marital status: Single    Spouse name: N/A  . Number of  children: N/A  . Years of education: N/A   Occupational History  . Not on file.   Social History Main Topics  . Smoking status: Former Smoker    Types: Cigarettes, Cigars  . Smokeless tobacco: Never Used  . Alcohol use No  . Drug use: No  . Sexual activity: Yes    Birth control/ protection: None   Other Topics Concern  . Not on file   Social History Narrative  . No narrative on file    Review of Systems: Constitutional: Negative for fever, chills, appetite change, weight loss,  Fatigue. Skin: Negative for rashes or lesions of concern. HENT: Negative for ear pain, ear discharge.nose bleeds. Painful wisdom teeth erupting Eyes: Negative for pain, discharge, redness, itching. Decline in vision Neck: Negative for pain, stiffness Respiratory: Negative for cough, shortness of breath,   Cardiovascular: Negative for chest pain,and leg swelling.Positive for occ palpitations Gastrointestinal: Negative for abdominal pain, nausea, vomiting, diarrhea, constipations Genitourinary: Negative for dysuria, urgency, frequency, hematuria,  Musculoskeletal: Negative for back pain, joint pain, joint  swelling, and gait problem.Negative for weakness. Neurological: Negative for dizziness, tremors, seizures, syncope,   light-headedness, numbness and headaches.  Hematological: Negative for easy bruising or bleeding Psychiatric/Behavioral: Negative for depression, decreased concentration, confusion. Positive for mild anxiety   Objective:   Vitals:   04/04/16 0905  BP: 134/87  Pulse: 61  Temp: 98.4 F (36.9  C)    Physical Exam: Constitutional: Patient appears well-developed and well-nourished. No distress. HENT: Normocephalic, atraumatic, External right and left ear normal. Oropharynx is clear and moist. Her 4 wisdom teeth are coming in and are crowded. Eyes: Conjunctivae and EOM are normal. PERRLA, no scleral icterus. Neck: Normal ROM. Neck supple. No lymphadenopathy, No thyromegaly. CVS:  RRR, S1/S2 +, no murmurs, no gallops, no rubs Pulmonary: Effort and breath sounds normal, no stridor, rhonchi, wheezes, rales.  Abdominal: Soft. Normoactive BS,, no distension, tenderness, rebound or guarding.  Musculoskeletal: Normal range of motion. No edema and no tenderness.  Neuro: Alert.Normal muscle tone coordination. Non-focal Skin: Skin is warm and dry. No rash noted. Not diaphoretic. No erythema. No pallor. Psychiatric: Normal mood and affect. Behavior, judgment, thought content normal.  No results found for: WBC, HGB, HCT, MCV, PLT No results found for: CREATININE, BUN, NA, K, CL, CO2  No results found for: HGBA1C Lipid Panel  No results found for: CHOL, TRIG, HDL, CHOLHDL, VLDL, LDLCALC     Assessment and plan:   1. Healthcare maintenance  - COMPLETE METABOLIC PANEL WITH GFR - CBC with Differential - Hemoglobin A1c  2. Pain, dental  - Ambulatory referral to Dentistry   Return in about 6 months (around 10/05/2016).  The patient was given clear instructions to go to ER or return to medical center if symptoms don't improve, worsen or new problems develop. The patient verbalized understanding.    Henrietta Hoover FNP  04/04/2016, 12:00 PM

## 2016-04-05 LAB — HEMOGLOBIN A1C
Hgb A1c MFr Bld: 5.4 % (ref <5.7)
MEAN PLASMA GLUCOSE: 108 mg/dL

## 2016-07-18 ENCOUNTER — Ambulatory Visit (HOSPITAL_COMMUNITY)
Admission: EM | Admit: 2016-07-18 | Discharge: 2016-07-18 | Disposition: A | Discharge status: Home / Self Care | Payer: No Typology Code available for payment source | Attending: Family Medicine | Admitting: Family Medicine

## 2016-07-18 ENCOUNTER — Encounter (HOSPITAL_COMMUNITY): Payer: Self-pay | Admitting: Emergency Medicine

## 2016-07-18 DIAGNOSIS — J00 Acute nasopharyngitis [common cold]: Secondary | ICD-10-CM

## 2016-07-18 MED ORDER — IPRATROPIUM BROMIDE 0.06 % NA SOLN: 2.0000 | Freq: Four times a day (QID) | NASAL | 12 refills | Status: DC

## 2016-07-18 NOTE — ED Provider Notes (Signed)
CSN: [id number]     Arrival date & time 07/18/16  1213 History   None    Chief Complaint  Patient presents with  . Sore Throat   (Consider location/radiation/quality/duration/timing/severity/associated sxs/prior Treatment) Patient c/o sore throat and hoarseness for 2 weeks.  She denies fever.   The history is provided by the patient.  Sore Throat  This is a new problem. The current episode started less than 1 hour ago. The problem occurs constantly. The problem has not changed since onset.Nothing relieves the symptoms. She has tried nothing for the symptoms.    History reviewed. No pertinent past medical history. History reviewed. No pertinent surgical history. Family History  Problem Relation Age of Onset  . Diabetes Other    Social History  Substance Use Topics  . Smoking status: Former Smoker    Types: Cigarettes, Cigars  . Smokeless tobacco: Never Used  . Alcohol use No   OB History    No data available     Review of Systems  Constitutional: Positive for fatigue.  HENT: Positive for sore throat.   Eyes: Negative.   Respiratory: Negative.   Cardiovascular: Negative.   Gastrointestinal: Negative.   Endocrine: Negative.   Genitourinary: Negative.   Musculoskeletal: Negative.   Allergic/Immunologic: Negative.   Neurological: Negative.   Hematological: Negative.   Psychiatric/Behavioral: Negative.     Allergies  Patient has no known allergies.  Home Medications   Prior to Admission medications   Medication Sig Start Date End Date Taking? Authorizing Provider  acidophilus (RISAQUAD) CAPS capsule Take 1 capsule by mouth daily.    Historical Provider, MD  benzonatate (TESSALON) 100 MG capsule Take 1 capsule (100 mg total) by mouth 3 (three) times daily as needed for cough. Patient not taking: Reported on 07/18/2016 01/28/16   Pricilla LovelessScott Goldston, MD  Cetirizine HCl 10 MG CAPS Take 1 capsule (10 mg total) by mouth daily. Patient not taking: Reported on 07/18/2016  01/28/16   Pricilla LovelessScott Goldston, MD  diclofenac (CATAFLAM) 50 MG tablet Take 1 tablet (50 mg total) by mouth 3 (three) times daily. For dental pain Patient not taking: Reported on 07/18/2016 01/10/16   Linna HoffJames D Kindl, MD  diphenhydrAMINE (BENADRYL) 25 mg capsule Take 50 mg by mouth every 6 (six) hours as needed for allergies.    Historical Provider, MD  ipratropium (ATROVENT) 0.06 % nasal spray Place 2 sprays into both nostrils 4 (four) times daily. 07/18/16   Deatra CanterWilliam J Kennedey Digilio, FNP  Phenylephrine-DM-GG-APAP [facility](MUCINEX COLD [facility]) 5-10-200-325 MG/10ML LIQD Take 20 mLs by mouth daily as needed (for cold).    Historical Provider, MD   Meds Ordered and Administered this Visit  Medications - No data to display  BP 102/68 (BP Location: Left Arm)   Pulse 63   Temp 98.9 F (37.2 C) (Oral)   Resp 16   LMP 07/12/2016 (Exact Date)   SpO2 97%  No data found.   Physical Exam  Constitutional: She appears well-developed and well-nourished.  HENT:  Head: Normocephalic and atraumatic.  Right Ear: External ear normal.  Left Ear: External ear normal.  Mouth/Throat: Oropharynx is clear and moist.  Eyes: EOM are normal. Pupils are equal, round, and reactive to light.  Neck: Normal range of motion. Neck supple.  Cardiovascular: Normal rate, regular rhythm and normal heart sounds.   Pulmonary/Chest: Effort normal and breath sounds normal.  Abdominal: Soft. Bowel sounds are normal.  Nursing note and vitals reviewed.   Urgent Care Course   Clinical Course  Procedures (including critical care time)  Labs Review Labs Reviewed - No data to display  Imaging Review No results found.   Visual Acuity Review  Right Eye Distance:   Left Eye Distance:   Bilateral Distance:    Right Eye Near:   Left Eye Near:    Bilateral Near:         MDM   1. Nasopharyngitis    Ipratropium 0.06% 2 sprays per nostril qid prn #3115ml Push po fluids, rest, tylenol and motrin otc prn as directed for  fever, arthralgias, and myalgias.  Follow up prn if sx's continue or persist.    Deatra CanterWilliam J Tanya Marvin, FNP 07/18/16 1337    Deatra CanterWilliam J Terrance Usery, FNP 07/18/16 [phone]

## 2016-07-18 NOTE — ED Triage Notes (Signed)
[facility] with a complaint of a sore throat and laryngitis x 2 days.

## 2016-10-05 ENCOUNTER — Ambulatory Visit: Payer: No Typology Code available for payment source | Admitting: Family Medicine

## 2017-10-25 IMAGING — CR DG CHEST 2V
2 series · 2 of 2 positions shown · non-contrast
Comparison: 12/12/2013

CLINICAL DATA: Cough for 3 weeks

EXAM:
CHEST  2 VIEW

[w chest pa]
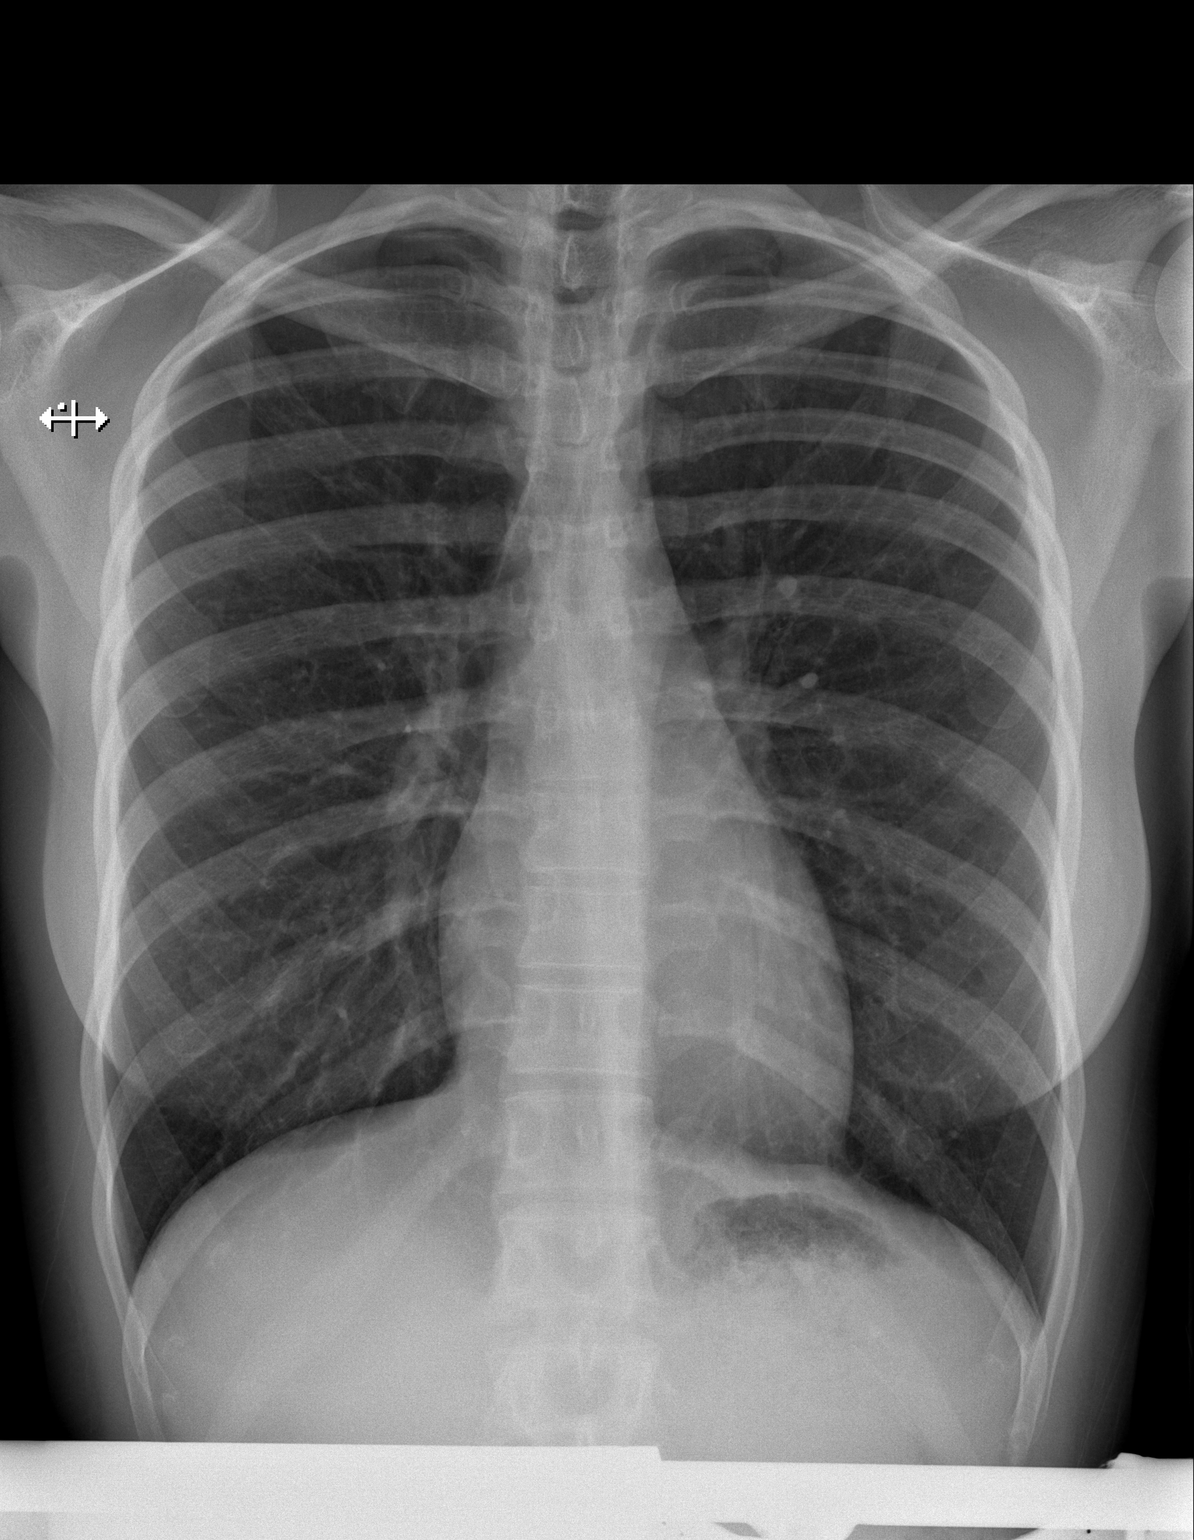

[w chest lat]
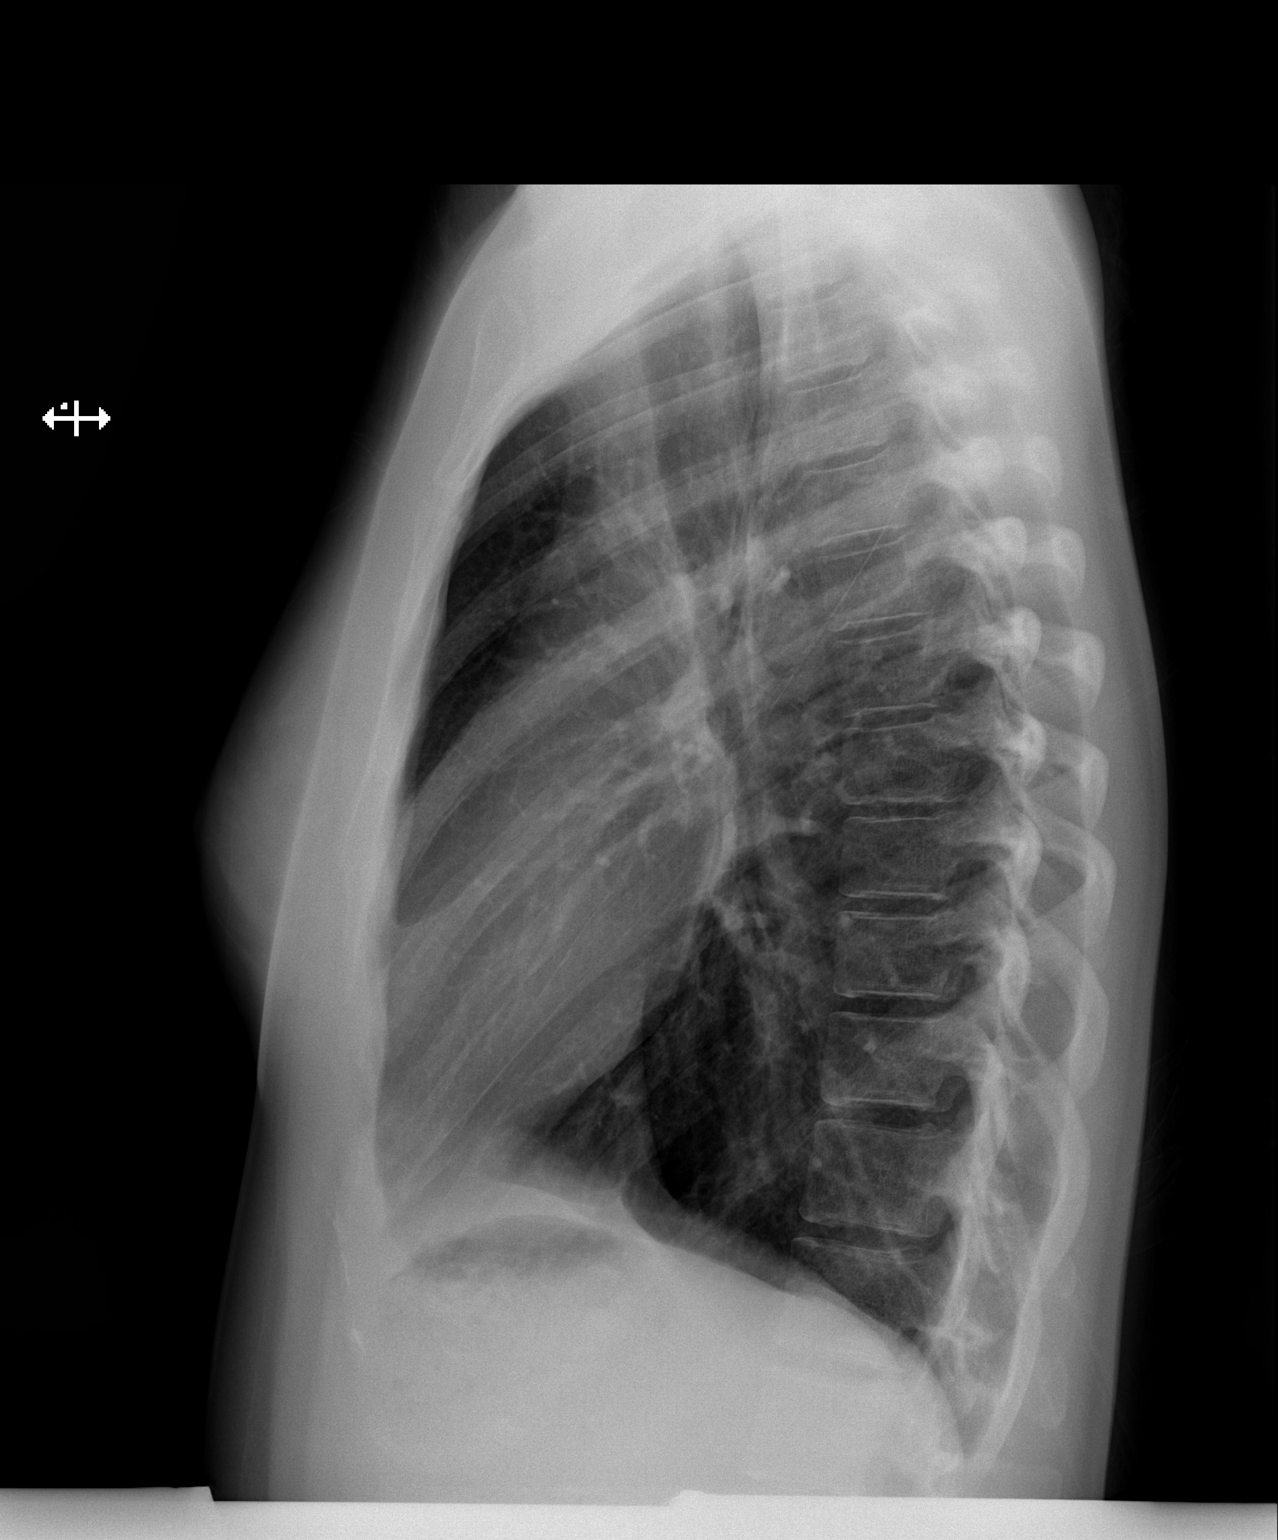

[2 of 2 positions shown; findings below may reference images not displayed]

FINDINGS: Cardiomediastinal silhouette is stable. No acute infiltrate or
pleural effusion. No pulmonary edema. Bony thorax is unremarkable.
IMPRESSION: No active cardiopulmonary disease.

## 2018-06-15 ENCOUNTER — Ambulatory Visit (HOSPITAL_COMMUNITY)
Admission: EM | Admit: 2018-06-15 | Discharge: 2018-06-15 | Disposition: A | Discharge status: Home / Self Care | Payer: PRIVATE HEALTH INSURANCE | Attending: Internal Medicine | Admitting: Internal Medicine

## 2018-06-15 ENCOUNTER — Encounter (HOSPITAL_COMMUNITY): Payer: Self-pay | Admitting: Emergency Medicine

## 2018-06-15 DIAGNOSIS — J40 Bronchitis, not specified as acute or chronic: Secondary | ICD-10-CM | POA: Diagnosis not present

## 2018-06-15 MED ORDER — BENZONATATE 100 MG PO CAPS: 100.0000 mg | ORAL_CAPSULE | Freq: Three times a day (TID) | ORAL | 0 refills | Status: DC

## 2018-06-15 MED ORDER — DOXYCYCLINE HYCLATE 100 MG PO CAPS: 100.0000 mg | ORAL_CAPSULE | Freq: Two times a day (BID) | ORAL | 0 refills | Status: DC

## 2018-06-15 MED ORDER — PREDNISONE 50 MG PO TABS: 50.0000 mg | ORAL_TABLET | Freq: Every day | ORAL | 0 refills | Status: DC

## 2018-06-15 MED ORDER — IPRATROPIUM BROMIDE 0.06 % NA SOLN: 2.0000 | Freq: Four times a day (QID) | NASAL | 0 refills | Status: DC

## 2018-06-15 NOTE — Discharge Instructions (Addendum)
Prednisone and doxycycline as directed. Tessalon for cough. Start atrovent nasal spray for nasal congestion/drainage. You can use over the counter nasal saline rinse such as neti pot for nasal congestion. Keep hydrated, your urine should be clear to pale yellow in color. Tylenol/motrin for fever and pain. Monitor for any worsening of symptoms, chest pain, shortness of breath, wheezing, swelling of the throat, follow up for reevaluation.   For sore throat/cough try using a honey-based tea. Use 3 teaspoons of honey with juice squeezed from half lemon. Place shaved pieces of ginger into 1/2-1 cup of water and warm over stove top. Then mix the ingredients and repeat every 4 hours as needed.

## 2018-06-15 NOTE — ED Triage Notes (Signed)
Pt here for URI sx x 3 days with cough and congestion

## 2018-06-15 NOTE — ED Provider Notes (Signed)
[facility]    CSN: [id number] Arrival date & time: 06/15/18  1426     History   Chief Complaint Chief Complaint  Patient presents with  . URI    HPI Carla Banks is a 27 y.o. female.   27 year old female comes in for 2 week history of URI symptoms. Has had rhinorrhea, nasal congestion, cough. States cough has been worsening throughout the weeks, and now with voice hoarseness. Cough is nonproductive. Denies fever, chills, night sweats. Former smoker. otc cold medication with temporary relief. Positive sick contact.     History reviewed. No pertinent past medical history.  There are no active problems to display for this patient.   History reviewed. No pertinent surgical history.  OB History   None      Home Medications    Prior to Admission medications   Medication Sig Start Date End Date Taking? Authorizing Provider  acidophilus (RISAQUAD) CAPS capsule Take 1 capsule by mouth daily.    [provider]  benzonatate (TESSALON) 100 MG capsule Take 1 capsule (100 mg total) by mouth every 8 (eight) hours. 06/15/18   Cathie Hoops, Kiki Bivens V, PA-C  doxycycline (VIBRAMYCIN) 100 MG capsule Take 1 capsule (100 mg total) by mouth 2 (two) times daily. 06/15/18   Cathie Hoops, Maliea Grandmaison V, PA-C  ipratropium (ATROVENT) 0.06 % nasal spray Place 2 sprays into both nostrils 4 (four) times daily. 06/15/18   Cathie Hoops, Raekwan Spelman V, PA-C  Phenylephrine-DM-GG-APAP (MUCINEX COLD [facility] THROAT CHILD) 5-10-200-325 MG/10ML LIQD Take 20 mLs by mouth daily as needed (for cold).    [provider]  predniSONE (DELTASONE) 50 MG tablet Take 1 tablet (50 mg total) by mouth daily. 06/15/18   Belinda Fisher, PA-C    Family History Family History  Problem Relation Age of Onset  . Diabetes Other     Social History Social History   Tobacco Use  . Smoking status: Former Smoker    Types: Cigarettes, Cigars  . Smokeless tobacco: Never Used  Substance Use Topics  . Alcohol use: No  . Drug use: No      Allergies   Patient has no known allergies.   Review of Systems Review of Systems  Reason unable to perform ROS: See HPI as above.     Physical Exam Triage Vital Signs ED Triage Vitals [06/15/18 1500]  Enc Vitals Group     BP 125/78     Pulse Rate 80     Resp 18     Temp 98.1 F (36.7 C)     Temp Source Oral     SpO2 100 %     Weight      Height      Head Circumference      Peak Flow      Pain Score 5     Pain Loc      Pain Edu?      Excl. in GC?    No data found.  Updated Vital Signs BP 125/78 (BP Location: Right Arm)   Pulse 80   Temp 98.1 F (36.7 C) (Oral)   Resp 18   SpO2 100%   Physical Exam  Constitutional: She is oriented to person, place, and time. She appears well-developed and well-nourished. No distress.  HENT:  Head: Normocephalic and atraumatic.  Right Ear: Tympanic membrane, external ear and ear canal normal. Tympanic membrane is not erythematous and not bulging.  Left Ear: Tympanic membrane, external ear and ear canal normal. Tympanic membrane is  not erythematous and not bulging.  Nose: Rhinorrhea present. Right sinus exhibits no maxillary sinus tenderness and no frontal sinus tenderness. Left sinus exhibits no maxillary sinus tenderness and no frontal sinus tenderness.  Mouth/Throat: Uvula is midline, oropharynx is clear and moist and mucous membranes are normal.  Eyes: Pupils are equal, round, and reactive to light. Conjunctivae are normal.  Neck: Normal range of motion. Neck supple.  Cardiovascular: Normal rate, regular rhythm and normal heart sounds. Exam reveals no gallop and no friction rub.  No murmur heard. Pulmonary/Chest: Effort normal and breath sounds normal. No accessory muscle usage or stridor. No respiratory distress. She has no decreased breath sounds. She has no wheezes. She has no rhonchi. She has no rales.  Coughing throughout exam.  Lymphadenopathy:    She has no cervical adenopathy.  Neurological: She is alert and  oriented to person, place, and time.  Skin: Skin is warm and dry.  Psychiatric: She has a normal mood and affect. Her behavior is normal. Judgment normal.     UC Treatments / Results  Labs (all labs ordered are listed, but only abnormal results are displayed) Labs Reviewed - No data to display  EKG None  Radiology No results found.  Procedures Procedures (including critical care time)  Medications Ordered in UC Medications - No data to display  Initial Impression / Assessment and Plan / UC Course  I have reviewed the triage vital signs and the nursing notes.  Pertinent labs & imaging results that were available during my care of the patient were reviewed by me and considered in my medical decision making (see chart for details).    Start doxycycline and prednisone as directed.  Other symptomatic treatment discussed.  Push fluids.  Return precautions given.  Patient expresses understanding and agrees to plan.  Final Clinical Impressions(s) / UC Diagnoses   Final diagnoses:  Bronchitis    ED Prescriptions    Medication Sig Dispense Auth. Provider   doxycycline (VIBRAMYCIN) 100 MG capsule Take 1 capsule (100 mg total) by mouth 2 (two) times daily. 20 capsule Maguadalupe Lata V, PA-C   predniSONE (DELTASONE) 50 MG tablet Take 1 tablet (50 mg total) by mouth daily. 5 tablet Shamari Lofquist V, PA-C   ipratropium (ATROVENT) 0.06 % nasal spray Place 2 sprays into both nostrils 4 (four) times daily. 15 mL Anyely Cunning V, PA-C   benzonatate (TESSALON) 100 MG capsule Take 1 capsule (100 mg total) by mouth every 8 (eight) hours. 21 capsule Threasa Alpha, [place] 06/15/18 1535

## 2018-07-26 ENCOUNTER — Ambulatory Visit (HOSPITAL_COMMUNITY)
Admission: EM | Admit: 2018-07-26 | Discharge: 2018-07-26 | Disposition: A | Discharge status: Home / Self Care | Payer: PRIVATE HEALTH INSURANCE | Attending: Internal Medicine | Admitting: Internal Medicine

## 2018-07-26 ENCOUNTER — Encounter (HOSPITAL_COMMUNITY): Payer: Self-pay | Admitting: Emergency Medicine

## 2018-07-26 ENCOUNTER — Other Ambulatory Visit: Payer: Self-pay

## 2018-07-26 DIAGNOSIS — K029 Dental caries, unspecified: Secondary | ICD-10-CM | POA: Insufficient documentation

## 2018-07-26 MED ORDER — HYDROCODONE-ACETAMINOPHEN 5-325 MG PO TABS: 2.0000 | ORAL_TABLET | ORAL | 0 refills | Status: DC | PRN

## 2018-07-26 NOTE — ED Triage Notes (Signed)
Dental pain started 2 weeks ago.  Particularly the bottom left is painful

## 2018-07-26 NOTE — ED Provider Notes (Signed)
[facility]    CSN: [id number] Arrival date & time: 07/26/18  1534     History   Chief Complaint Chief Complaint  Patient presents with  . Dental Pain    HPI Darin Engelsaisley Lemke is a 27 y.o. female.   27 yo female complains of dental pain.  Pain x weeks, probably more than 1 month.  This is a recurrent problem.  Patient states pain is worse when air hits the teeth particularly on the posterior left bottom row. She denies fever, N/V or jaw swelling (with this encounter). She does not have good insurance which has prevented her from taking care of this issue in the past.      History reviewed. No pertinent past medical history.  There are no active problems to display for this patient.   History reviewed. No pertinent surgical history.  OB History   No obstetric history on file.      Home Medications    Prior to Admission medications   Medication Sig Start Date End Date Taking? Authorizing Provider  acidophilus (RISAQUAD) CAPS capsule Take 1 capsule by mouth daily.    [provider]  benzonatate (TESSALON) 100 MG capsule Take 1 capsule (100 mg total) by mouth every 8 (eight) hours. 06/15/18   Cathie HoopsYu, Amy V, PA-C  doxycycline (VIBRAMYCIN) 100 MG capsule Take 1 capsule (100 mg total) by mouth 2 (two) times daily. 06/15/18   Cathie HoopsYu, Amy V, PA-C  HYDROcodone-acetaminophen (NORCO/VICODIN) 5-325 MG tablet Take 2 tablets by mouth every 4 (four) hours as needed. 07/26/18   Arnaldo Nataliamond, Trygg Mantz S, MD  ipratropium (ATROVENT) 0.06 % nasal spray Place 2 sprays into both nostrils 4 (four) times daily. 06/15/18   Cathie HoopsYu, Amy V, PA-C  Phenylephrine-DM-GG-APAP ([facility]UCINEX COLD [facility]) 5-10-200-325 MG/10ML LIQD Take 20 mLs by mouth daily as needed (for cold).    [provider]  predniSONE (DELTASONE) 50 MG tablet Take 1 tablet (50 mg total) by mouth daily. 06/15/18   Belinda FisherYu, Amy V, PA-C    Family History Family History  Problem Relation Age of Onset  . Diabetes Other      Social History Social History   Tobacco Use  . Smoking status: Former Smoker    Types: Cigarettes, Cigars  . Smokeless tobacco: Never Used  Substance Use Topics  . Alcohol use: No  . Drug use: No     Allergies   Patient has no known allergies.   Review of Systems Review of Systems  Constitutional: Negative for chills and fever.  HENT: Positive for dental problem. Negative for ear pain, facial swelling, sore throat, tinnitus and trouble swallowing.   Eyes: Negative for redness.  Respiratory: Negative for cough and shortness of breath.   Cardiovascular: Negative for chest pain and palpitations.  Gastrointestinal: Negative for abdominal pain, diarrhea, nausea and vomiting.  Genitourinary: Negative for dysuria, frequency and urgency.  Musculoskeletal: Negative for myalgias.  Skin: Negative for rash.       No lesions  Neurological: Negative for weakness.  Hematological: Does not bruise/bleed easily.  Psychiatric/Behavioral: Negative for suicidal ideas.     Physical Exam Triage Vital Signs ED Triage Vitals  Enc Vitals Group     BP 07/26/18 1557 106/72     Pulse Rate 07/26/18 1557 64     Resp 07/26/18 1557 18     Temp 07/26/18 1557 97.7 F (36.5 C)     Temp Source 07/26/18 1557 Oral     SpO2 07/26/18 1557 100 %  Weight --      Height --      Head Circumference --      Peak Flow --      Pain Score 07/26/18 1554 9     Pain Loc --      Pain Edu? --      Excl. in GC? --    No data found.  Updated Vital Signs BP 106/72 (BP Location: Left Arm)   Pulse 64   Temp 97.7 F (36.5 C) (Oral)   Resp 18   LMP 07/19/2018   SpO2 100%   Visual Acuity Right Eye Distance:   Left Eye Distance:   Bilateral Distance:    Right Eye Near:   Left Eye Near:    Bilateral Near:     Physical Exam Vitals signs and nursing note reviewed.  Constitutional:      General: She is not in acute distress.    Appearance: She is well-developed.  HENT:     Head: Normocephalic  and atraumatic.     Jaw: Malocclusion present. No tenderness or swelling.     Comments: Caries noted in molars; also macerated but non-erythematous non-purulent tissue overlying lower molars Eyes:     General: No scleral icterus.    Conjunctiva/sclera: Conjunctivae normal.     Pupils: Pupils are equal, round, and reactive to light.  Neck:     Musculoskeletal: Normal range of motion and neck supple.     Thyroid: No thyromegaly.     Vascular: No JVD.     Trachea: No tracheal deviation.  Cardiovascular:     Rate and Rhythm: Normal rate and regular rhythm.     Heart sounds: Normal heart sounds. No murmur. No friction rub. No gallop.   Pulmonary:     Effort: Pulmonary effort is normal.     Breath sounds: Normal breath sounds.  Abdominal:     General: Bowel sounds are normal. There is no distension.     Palpations: Abdomen is soft.     Tenderness: There is no abdominal tenderness.  Musculoskeletal: Normal range of motion.  Lymphadenopathy:     Cervical: No cervical adenopathy.  Skin:    General: Skin is warm and dry.  Neurological:     Mental Status: She is alert and oriented to person, place, and time.     Cranial Nerves: No cranial nerve deficit.  Psychiatric:        Behavior: Behavior normal.        Thought Content: Thought content normal.        Judgment: Judgment normal.      UC Treatments / Results  Labs (all labs ordered are listed, but only abnormal results are displayed) Labs Reviewed - No data to display  EKG None  Radiology No results found.  Procedures Procedures (including critical care time)  Medications Ordered in UC Medications - No data to display  Initial Impression / Assessment and Plan / UC Course  I have reviewed the triage vital signs and the nursing notes.  Pertinent labs & imaging results that were available during my care of the patient were reviewed by me and considered in my medical decision making (see chart for details).      Impacted molars with caries.  Currently not infected; no evidence of abscess. Provided list of dentists and discussed care credit/HSA funding for dental procedures.    Final Clinical Impressions(s) / UC Diagnoses   Final diagnoses:  Dental caries   Discharge Instructions   None  ED Prescriptions    Medication Sig Dispense Auth. Provider   HYDROcodone-acetaminophen (NORCO/VICODIN) 5-325 MG tablet Take 2 tablets by mouth every 4 (four) hours as needed. 10 tablet Arnaldo Nataliamond, Rashell Shambaugh S, MD     Controlled Substance Prescriptions [place] Controlled Substance Registry consulted? Not Applicable   Arnaldo Nataliamond, Enzley Kitchens S, MD 07/26/18 1721

## 2018-09-18 ENCOUNTER — Ambulatory Visit (HOSPITAL_COMMUNITY)
Admission: AD | Admit: 2018-09-18 | Discharge: 2018-09-18 | Disposition: A | Discharge status: Home / Self Care | Payer: PRIVATE HEALTH INSURANCE | Attending: Family Medicine | Admitting: Family Medicine

## 2018-09-18 ENCOUNTER — Other Ambulatory Visit: Payer: Self-pay

## 2018-09-18 ENCOUNTER — Encounter (HOSPITAL_COMMUNITY): Payer: Self-pay

## 2018-09-18 DIAGNOSIS — K0889 Other specified disorders of teeth and supporting structures: Secondary | ICD-10-CM

## 2018-09-18 MED ORDER — TRAMADOL HCL 50 MG PO TABS: 50.0000 mg | ORAL_TABLET | Freq: Four times a day (QID) | ORAL | 0 refills | Status: DC | PRN

## 2018-09-18 NOTE — ED Provider Notes (Signed)
[facility]   [id number] 09/18/18 Arrival Time: 1840  ASSESSMENT & PLAN:  1. Pain, dental    No sign of abscess requiring I&D at this time. Discussed. She has been told she needs her wisdom teeth removed. No obvious sign of infection.  Meds ordered this encounter  Medications  . traMADol (ULTRAM) 50 MG tablet    Sig: Take 1 tablet (50 mg total) by mouth every 6 (six) hours as needed.    Dispense:  15 tablet    Refill:  0   [place] Controlled Substances Registry consulted for this patient. I feel the risk/benefit ratio today is favorable for proceeding with this prescription for a controlled substance. Medication sedation precautions given.  Dental resource written instructions given. She will schedule dental evaluation as soon as possible.  Reviewed expectations re: course of current medical issues. Questions answered. Outlined signs and symptoms indicating need for more acute intervention. Patient verbalized understanding. After Visit Summary given.   SUBJECTIVE:  Carla Banks is a 28 y.o. female who reports gradual onset of right upper, right lower dental pain "of my wisdom teeth" described as aching. Present on/off for the past year; mild exacerbation over this past week. Fever: absent. Tolerating PO intake but reports pain with chewing. Normal swallowing. She does not see a dentist regularly. No neck swelling or pain. OTC analgesics without relief.  ROS: As per HPI.  OBJECTIVE: Vitals:   09/18/18 1941 09/18/18 1942  BP:  109/72  Resp:  18  Temp:  98.7 F (37.1 C)  SpO2:  100%  Weight: 65.3 kg     General appearance: alert; no distress HENT: normocephalic; atraumatic; dentition: fair; right upper, right lower gums without areas of fluctuance, drainage, or bleeding; normal jaw movement without difficulty Neck: supple without LAD; FROM; trachea midline Lungs: normal respirations; unlabored Skin: warm and dry Psychological: alert and cooperative; normal mood  and affect  No Known Allergies   Social History   Socioeconomic History  . Marital status: Single    Spouse name: Not on file  . Number of children: Not on file  . Years of education: Not on file  . Highest education level: Not on file  Occupational History  . Not on file  Social Needs  . Financial resource strain: Not on file  . Food insecurity:    Worry: Not on file    Inability: Not on file  . Transportation needs:    Medical: Not on file    Non-medical: Not on file  Tobacco Use  . Smoking status: Former Smoker    Types: Cigarettes, Cigars  . Smokeless tobacco: Never Used  Substance and Sexual Activity  . Alcohol use: No  . Drug use: No  . Sexual activity: Yes    Birth control/protection: None  Lifestyle  . Physical activity:    Days per week: Not on file    Minutes per session: Not on file  . Stress: Not on file  Relationships  . Social connections:    Talks on phone: Not on file    Gets together: Not on file    Attends religious service: Not on file    Active member of club or organization: Not on file    Attends meetings of clubs or organizations: Not on file    Relationship status: Not on file  . Intimate partner violence:    Fear of current or ex partner: Not on file    Emotionally abused: Not on file    Physically  abused: Not on file    Forced sexual activity: Not on file  Other Topics Concern  . Not on file  Social History Narrative  . Not on file   Family History  Problem Relation Age of Onset  . Diabetes Other    History reviewed. No pertinent surgical history.   Mardella Layman, MD 09/19/18 [phone]

## 2018-09-18 NOTE — ED Triage Notes (Signed)
Pt cc dental pain  Wisdom tooth pain. Pt state she having headaches from the pain. X 2 weeks or more.

## 2018-09-18 NOTE — Discharge Instructions (Addendum)
Be aware, pain medications may cause drowsiness. Please do not drive, operate heavy machinery or make important decisions while on this medication, it can cloud your judgement.  

## 2019-03-15 ENCOUNTER — Other Ambulatory Visit: Payer: Self-pay

## 2019-03-15 ENCOUNTER — Ambulatory Visit (HOSPITAL_COMMUNITY)
Admission: EM | Admit: 2019-03-15 | Discharge: 2019-03-15 | Disposition: A | Discharge status: Home / Self Care | Payer: PRIVATE HEALTH INSURANCE | Attending: Family Medicine | Admitting: Family Medicine

## 2019-03-15 ENCOUNTER — Encounter (HOSPITAL_COMMUNITY): Payer: Self-pay | Admitting: Emergency Medicine

## 2019-03-15 DIAGNOSIS — R42 Dizziness and giddiness: Secondary | ICD-10-CM

## 2019-03-15 MED ORDER — MECLIZINE HCL 12.5 MG PO TABS: 12.5000 mg | ORAL_TABLET | Freq: Three times a day (TID) | ORAL | 0 refills | Status: AC | PRN

## 2019-03-15 MED ORDER — ONDANSETRON 4 MG PO TBDP: 4.0000 mg | ORAL_TABLET | Freq: Three times a day (TID) | ORAL | 0 refills | Status: AC | PRN

## 2019-03-15 NOTE — ED Provider Notes (Signed)
[facility]    CSN: [id number] Arrival date & time: 03/15/19  1215      History   Chief Complaint Chief Complaint  Patient presents with  . Dizziness    HPI Carla Banks is a 28 y.o. female no significant past medical history presenting today for evaluation of dizziness.  Patient states that she has had dizziness for the past week.  Dizziness is been unrelenting since and does not come and go.  She has had associated nausea.  She has had mild intermittent headaches, but this has not been persistent.  She notes that she has some light sensitivity with this, but denies worsening pain with lights.  She denies any vomiting.  Denies any pain.  Last menstrual cycle was 2 weeks ago.  She is now on any form of birth control.  Denies recent illness.  Denies URI symptoms of cough, congestion or sore throat.  Denies chest pain or shortness of breath.  Denies palpitations.  Denies any change in habits or diet.  Does note that she does not drink a lot of fluids, but this is not new.  Denies change in activity.  Denies use of herbal supplements or energy drinks.  She denies abdominal pain, pelvic pain, nausea or vomiting.  Denies urinary symptoms of dysuria or increased frequency.  Denies room spinning, lightheadedness, presyncope sensation with the dizziness.  Patient is an occasional tobacco user.  Denies use of medicines.  Denies any chronic medical conditions.  HPI  History reviewed. No pertinent past medical history.  There are no active problems to display for this patient.   History reviewed. No pertinent surgical history.  OB History   No obstetric history on file.      Home Medications    Prior to Admission medications   Medication Sig Start Date End Date Taking? Authorizing Provider  acidophilus (RISAQUAD) CAPS capsule Take 1 capsule by mouth daily.    [provider]  meclizine (ANTIVERT) 12.5 MG tablet Take 1 tablet (12.5 mg total) by mouth 3 (three) times  daily as needed for dizziness. 03/15/19   ,  C, PA-C  ondansetron (ZOFRAN ODT) 4 MG disintegrating tablet Take 1 tablet (4 mg total) by mouth every 8 (eight) hours as needed for nausea or vomiting. 03/15/19   ,  C, PA-C  ipratropium (ATROVENT) 0.06 % nasal spray Place 2 sprays into both nostrils 4 (four) times daily. 06/15/18 03/15/19  Ok Edwards, PA-C    Family History Family History  Problem Relation Age of Onset  . Diabetes Other     Social History Social History   Tobacco Use  . Smoking status: Former Smoker    Types: Cigarettes, Cigars  . Smokeless tobacco: Never Used  Substance Use Topics  . Alcohol use: No  . Drug use: No     Allergies   Patient has no known allergies.   Review of Systems Review of Systems  Constitutional: Negative for fatigue and fever.  HENT: Negative for congestion, sinus pressure and sore throat.   Eyes: Positive for photophobia. Negative for pain and visual disturbance.  Respiratory: Negative for cough and shortness of breath.   Cardiovascular: Negative for chest pain.  Gastrointestinal: Positive for nausea. Negative for abdominal pain and vomiting.  Genitourinary: Negative for decreased urine volume and hematuria.  Musculoskeletal: Negative for myalgias, neck pain and neck stiffness.  Neurological: Positive for dizziness and headaches. Negative for syncope, facial asymmetry, speech difficulty, weakness, light-headedness and numbness.  Physical Exam Triage Vital Signs ED Triage Vitals [03/15/19 1305]  Enc Vitals Group     BP 127/75     Pulse Rate (!) 57     Resp 18     Temp (!) 97.5 F (36.4 C)     Temp Source Oral     SpO2 100 %     Weight      Height      Head Circumference      Peak Flow      Pain Score 0     Pain Loc      Pain Edu?      Excl. in GC?    No data found.  Updated Vital Signs BP 127/75 (BP Location: Right Arm)   Pulse (!) 57   Temp (!) 97.5 F (36.4 C) (Oral)   Resp 18   SpO2 100%    Visual Acuity Right Eye Distance:   Left Eye Distance:   Bilateral Distance:    Right Eye Near:   Left Eye Near:    Bilateral Near:     Physical Exam Vitals signs and nursing note reviewed.  Constitutional:      General: She is not in acute distress.    Appearance: She is well-developed.     Comments: Well-appearing, sitting in exam chair, no acute distress  HENT:     Head: Normocephalic and atraumatic.     Ears:     Comments: Bilateral ears without tenderness to palpation of external auricle, tragus and mastoid, EAC's without erythema or swelling, TM's with good bony landmarks and cone of light. Non erythematous.    Mouth/Throat:     Comments: Oral mucosa pink and moist, no tonsillar enlargement or exudate. Posterior pharynx patent and nonerythematous, no uvula deviation or swelling. Normal phonation. Palate elevates symmetrically Eyes:     Extraocular Movements: Extraocular movements intact.     Conjunctiva/sclera: Conjunctivae normal.     Pupils: Pupils are equal, round, and reactive to light.  Neck:     Musculoskeletal: Neck supple.     Comments: Full active range of motion Cardiovascular:     Rate and Rhythm: Normal rate and regular rhythm.     Heart sounds: No murmur.  Pulmonary:     Effort: Pulmonary effort is normal. No respiratory distress.     Breath sounds: Normal breath sounds.     Comments: Breathing comfortably at rest, CTABL, no wheezing, rales or other adventitious sounds auscultated Abdominal:     Palpations: Abdomen is soft.     Tenderness: There is no abdominal tenderness.  Musculoskeletal:     Comments: Bilateral calves symmetric, nontender to palpation of calf belly  Skin:    General: Skin is warm and dry.  Neurological:     General: No focal deficit present.     Mental Status: She is alert and oriented to person, place, and time. Mental status is at baseline.     Comments: Patient A&O x3, cranial nerves II-XII grossly intact, strength at  shoulders, hips and knees 5/5, equal bilaterally, patellar reflex 2+ bilaterally. Gait without abnormality.      UC Treatments / Results  Labs (all labs ordered are listed, but only abnormal results are displayed) Labs Reviewed - No data to display  EKG   Radiology No results found.  Procedures Procedures (including critical care time)  Medications Ordered in UC Medications - No data to display  Initial Impression / Assessment and Plan / UC Course  I have reviewed the  triage vital signs and the nursing notes.  Pertinent labs & imaging results that were available during my care of the patient were reviewed by me and considered in my medical decision making (see chart for details).     Exam unremarkable, vital signs stable.  Unclear cause of patient's dizziness and nausea.  Seems less vertiginous.  Less menstrual cycle was 2 weeks ago, less likely pregnancy, but did mention this to the patient.  Patient is stable.  Initially offered basic lab work, EKG, patient declined and wished to continue to monitor.  She did not wish to proceed with any lab work/testing.  Patient does seem stable for continued monitoring without work-up.  Will provide with Zofran to use as needed for nausea, may try meclizine.  Advised to follow-up here with primary care for further evaluation of symptoms if persisting.Discussed strict return precautions. Patient verbalized understanding and is agreeable with plan.  Final Clinical Impressions(s) / UC Diagnoses   Final diagnoses:  Dizziness     Discharge Instructions     Try meclizine for dizziness and nausea- will cause drowsiness, do not drive or work after taking Zofran for nausea if needing to drive or work.   Follow up here or with primary care if symptoms persisting or worsening   ED Prescriptions    Medication Sig Dispense Auth. Provider   meclizine (ANTIVERT) 12.5 MG tablet Take 1 tablet (12.5 mg total) by mouth 3 (three) times daily as  needed for dizziness. 30 tablet ,  C, PA-C   ondansetron (ZOFRAN ODT) 4 MG disintegrating tablet Take 1 tablet (4 mg total) by mouth every 8 (eight) hours as needed for nausea or vomiting. 20 tablet , [place] C, PA-C     Controlled Substance Prescriptions [place] Controlled Substance Registry consulted? Not Applicable   Lew Dawes,  C, [place]PA-C 03/15/19 1531

## 2019-03-15 NOTE — Discharge Instructions (Signed)
Try meclizine for dizziness and nausea- will cause drowsiness, do not drive or work after taking Zofran for nausea if needing to drive or work.   Follow up here or with primary care if symptoms persisting or worsening

## 2019-03-15 NOTE — ED Triage Notes (Signed)
Pt sts dizziness x 1 week; pt unsure of cause

## 2020-04-11 ENCOUNTER — Other Ambulatory Visit: Payer: Self-pay

## 2020-04-11 ENCOUNTER — Ambulatory Visit (HOSPITAL_COMMUNITY)
Admission: EM | Admit: 2020-04-11 | Discharge: 2020-04-11 | Disposition: A | Discharge status: Home / Self Care | Payer: PRIVATE HEALTH INSURANCE

## 2020-04-11 NOTE — ED Notes (Signed)
Carla Banks, patient access reports patient did not want to wait to be seen

## 2022-12-19 ENCOUNTER — Ambulatory Visit (HOSPITAL_COMMUNITY)
Admission: EM | Admit: 2022-12-19 | Discharge: 2022-12-19 | Disposition: A | Discharge status: Home / Self Care | Payer: No Typology Code available for payment source | Attending: Physician Assistant | Admitting: Physician Assistant

## 2022-12-19 ENCOUNTER — Other Ambulatory Visit: Payer: Self-pay

## 2022-12-19 ENCOUNTER — Encounter (HOSPITAL_COMMUNITY): Payer: Self-pay | Admitting: *Deleted

## 2022-12-19 DIAGNOSIS — J069 Acute upper respiratory infection, unspecified: Secondary | ICD-10-CM | POA: Insufficient documentation

## 2022-12-19 DIAGNOSIS — R0981 Nasal congestion: Secondary | ICD-10-CM | POA: Insufficient documentation

## 2022-12-19 DIAGNOSIS — R051 Acute cough: Secondary | ICD-10-CM | POA: Diagnosis present

## 2022-12-19 LAB — POCT RAPID STREP A (OFFICE): Rapid Strep A Screen: NEGATIVE

## 2022-12-19 MED ORDER — FLUTICASONE PROPIONATE 50 MCG/ACT NA SUSP: 1.0000 | Freq: Every day | NASAL | 0 refills | Status: DC

## 2022-12-19 MED ORDER — CETIRIZINE HCL 10 MG PO TABS: 10.0000 mg | ORAL_TABLET | Freq: Every day | ORAL | 0 refills | Status: DC

## 2022-12-19 MED ORDER — PROMETHAZINE-DM 6.25-15 MG/5ML PO SYRP: 5.0000 mL | ORAL_SOLUTION | Freq: Two times a day (BID) | ORAL | 0 refills | Status: DC | PRN

## 2022-12-19 NOTE — ED Triage Notes (Signed)
Pt reports her Sx's started on Friday. Today pt reports bil ear pain and sore throat . Pt took there a flu.

## 2022-12-19 NOTE — Discharge Instructions (Signed)
Your strep test was negative.  I will send this for culture and contact you if it is positive.  I do not see any reason to start antibiotics on your exam today.  I believe you have a virus causing your symptoms.  Start cetirizine at night.  Use Flonase daily to help with your congestion symptoms.  Take Promethazine DM twice daily.  This will make you sleepy so do not drive or drink alcohol while taking it.  Gargle with warm salt water and use nasal saline/sinus rinses for additional symptom relief.  If your symptoms or not improving within a week please return for reevaluation.  If anything worsens and you have worsening cough, shortness of breath, fever, nausea, vomiting, weakness you should be seen immediately.
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
## 2022-12-21 LAB — CULTURE, GROUP A STREP (THRC)

## 2023-01-04 ENCOUNTER — Encounter (HOSPITAL_COMMUNITY): Payer: Self-pay

## 2023-01-04 ENCOUNTER — Ambulatory Visit (HOSPITAL_COMMUNITY): Payer: No Typology Code available for payment source

## 2023-01-04 ENCOUNTER — Ambulatory Visit (HOSPITAL_COMMUNITY)
Admission: EM | Admit: 2023-01-04 | Discharge: 2023-01-04 | Disposition: A | Discharge status: Home / Self Care | Payer: No Typology Code available for payment source | Attending: Nurse Practitioner | Admitting: Nurse Practitioner

## 2023-01-04 DIAGNOSIS — M542 Cervicalgia: Secondary | ICD-10-CM | POA: Diagnosis not present

## 2023-01-04 DIAGNOSIS — S4991XA Unspecified injury of right shoulder and upper arm, initial encounter: Secondary | ICD-10-CM

## 2023-01-04 LAB — POCT URINE PREGNANCY: Preg Test, Ur: NEGATIVE
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
## 2023-01-04 NOTE — Discharge Instructions (Addendum)
Overall your x-ray is negative for any acute abnormalities.  Cervical spine: Mild degenerative disc disease at C5-6.  We do encourage you to follow up with your primary care provider if your symptoms persist for further evaluation with additional imaging. We do recommend that you use over the counter Tylenol or Ibuprofen as directed (do not exceed daily limits).
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
## 2023-05-04 ENCOUNTER — Encounter (HOSPITAL_COMMUNITY): Payer: Self-pay | Admitting: Internal Medicine

## 2023-05-04 ENCOUNTER — Ambulatory Visit (HOSPITAL_COMMUNITY)
Admission: EM | Admit: 2023-05-04 | Discharge: 2023-05-04 | Disposition: A | Discharge status: Home / Self Care | Payer: PRIVATE HEALTH INSURANCE | Attending: Internal Medicine | Admitting: Internal Medicine

## 2023-05-04 DIAGNOSIS — R0981 Nasal congestion: Secondary | ICD-10-CM | POA: Diagnosis not present

## 2023-05-04 MED ORDER — FLUTICASONE PROPIONATE 50 MCG/ACT NA SUSP: 1.0000 | Freq: Every day | NASAL | 0 refills | Status: AC

## 2023-05-04 MED ORDER — FEXOFENADINE-PSEUDOEPHED ER 60-120 MG PO TB12: 1.0000 | ORAL_TABLET | Freq: Two times a day (BID) | ORAL | 0 refills | Status: AC

## 2023-05-04 NOTE — ED Triage Notes (Signed)
Pt has sinus pressure that started yesterday. Denies any other symptoms.

## 2023-05-04 NOTE — ED Provider Notes (Signed)
[facility]    CSN: [id number] Arrival date & time: 05/04/23  1601      History   Chief Complaint Chief Complaint  Patient presents with   Nasal Congestion   Facial Pain    HPI Eldona Adrien is a 32 y.o. female who presents with nose bridge pressure on since yesterday and was not feeling good, so she called into work and will need a noted. Has mild post nasal drainage, but denies any other symptoms.  Has hx of allergies.     History reviewed. No pertinent past medical history.  There are no problems to display for this patient.   History reviewed. No pertinent surgical history.  OB History   No obstetric history on file.      Home Medications    Prior to Admission medications   Medication Sig Start Date End Date Taking? Authorizing Provider  fexofenadine-pseudoephedrine (ALLEGRA-D) 60-120 MG 12 hr tablet Take 1 tablet by mouth every 12 (twelve) hours. 05/04/23  Yes Rodriguez-Southworth, Nettie Elm, PA-C  fluticasone (FLONASE) 50 MCG/ACT nasal spray Place 1 spray into both nostrils daily. 05/04/23   Rodriguez-Southworth, Nettie Elm, PA-C  meclizine (ANTIVERT) 12.5 MG tablet Take 1 tablet (12.5 mg total) by mouth 3 (three) times daily as needed for dizziness. 03/15/19   Wieters, Hallie C, PA-C  ondansetron (ZOFRAN ODT) 4 MG disintegrating tablet Take 1 tablet (4 mg total) by mouth every 8 (eight) hours as needed for nausea or vomiting. 03/15/19   Wieters, Hallie C, PA-C  ipratropium (ATROVENT) 0.06 % nasal spray Place 2 sprays into both nostrils 4 (four) times daily. 06/15/18 03/15/19  Belinda Fisher, PA-C    Family History Family History  Problem Relation Age of Onset   Diabetes Other     Social History Social History   Tobacco Use   Smoking status: Former    Types: Cigarettes, Cigars   Smokeless tobacco: Never  Substance Use Topics   Alcohol use: No   Drug use: No     Allergies   Patient has no known allergies.   Review of Systems Review of Systems As  noted in HPI  Physical Exam Triage Vital Signs ED Triage Vitals  Encounter Vitals Group     BP 05/04/23 1617 (!) 114/56     Systolic BP Percentile --      Diastolic BP Percentile --      Pulse Rate 05/04/23 1617 (!) 55     Resp 05/04/23 1617 16     Temp 05/04/23 1617 98.1 F (36.7 C)     Temp Source 05/04/23 1617 Oral     SpO2 05/04/23 1617 98 %     Weight --      Height --      Head Circumference --      Peak Flow --      Pain Score 05/04/23 1616 6     Pain Loc --      Pain Education --      Exclude from Growth Chart --    No data found.  Updated Vital Signs BP (!) 114/56 (BP Location: Left Arm)   Pulse (!) 55   Temp 98.1 F (36.7 C) (Oral)   Resp 16   LMP 03/28/2023 (Approximate)   SpO2 98%   Visual Acuity Right Eye Distance:   Left Eye Distance:   Bilateral Distance:    Right Eye Near:   Left Eye Near:    Bilateral Near:     Physical Exam Vitals and  nursing note reviewed.  Constitutional:      Appearance: She is normal weight.  HENT:     Right Ear: External ear normal.     Left Ear: External ear normal.     Nose: Congestion present.     Comments: Clear mucous seen. Does not have sinus tenderness Eyes:     General: No scleral icterus.    Conjunctiva/sclera: Conjunctivae normal.  Cardiovascular:     Rate and Rhythm: Normal rate and regular rhythm.  Pulmonary:     Effort: Pulmonary effort is normal.  Musculoskeletal:        General: Normal range of motion.     Cervical back: Neck supple.  Lymphadenopathy:     Cervical: No cervical adenopathy.  Skin:    General: Skin is warm and dry.  Neurological:     Mental Status: She is alert and oriented to person, place, and time.     Gait: Gait normal.  Psychiatric:        Mood and Affect: Mood normal.        Behavior: Behavior normal.        Thought Content: Thought content normal.        Judgment: Judgment normal.      UC Treatments / Results  Labs (all labs ordered are listed, but only abnormal  results are displayed) Labs Reviewed - No data to display  EKG   Radiology No results found.  Procedures Procedures (including critical care time)  Medications Ordered in UC Medications - No data to display  Initial Impression / Assessment and Plan / UC Course  I have reviewed the triage vital signs and the nursing notes.  Nose Congestion  I explained to her that I dont know if she is about to have a cold or her allergies are causing her symptoms. I advised her to do saline nose rinses bid, and I placed her on Allegra D as noted.  Work note given  Final Clinical Impressions(s) / UC Diagnoses   Final diagnoses:  Nose congestion     Discharge Instructions      Do the saline rinses twice a day, but do not do it before bed time Take immune support things like Vit C, zinc and Vitamin D if this ends up being a virus     ED Prescriptions     Medication Sig Dispense Auth. Provider   fexofenadine-pseudoephedrine (ALLEGRA-D) 60-120 MG 12 hr tablet Take 1 tablet by mouth every 12 (twelve) hours. 30 tablet Rodriguez-Southworth, Anis Degidio, PA-C   fluticasone (FLONASE) 50 MCG/ACT nasal spray Place 1 spray into both nostrils daily. 16 g Rodriguez-Southworth, Nettie Elm, PA-C      PDMP not reviewed this encounter.   Garey Ham, PA-C 05/04/23 1750

## 2023-05-04 NOTE — Discharge Instructions (Signed)
Do the saline rinses twice a day, but do not do it before bed time Take immune support things like Vit C, zinc and Vitamin D if this ends up being a virus

## 2024-09-05 ENCOUNTER — Emergency Department (HOSPITAL_COMMUNITY)
Admission: EM | Admit: 2024-09-05 | Discharge: 2024-09-06 | Disposition: A | Discharge status: Home / Self Care | Source: Ambulatory Visit | Attending: Emergency Medicine | Admitting: Emergency Medicine

## 2024-09-05 ENCOUNTER — Emergency Department (HOSPITAL_COMMUNITY)

## 2024-09-05 DIAGNOSIS — N939 Abnormal uterine and vaginal bleeding, unspecified: Secondary | ICD-10-CM | POA: Insufficient documentation

## 2024-09-05 DIAGNOSIS — R072 Precordial pain: Secondary | ICD-10-CM | POA: Insufficient documentation

## 2024-09-05 LAB — CBC WITH DIFFERENTIAL/PLATELET
Abs Immature Granulocytes: 0.01 10*3/uL (ref 0.00–0.07)
Basophils Absolute: 0 10*3/uL (ref 0.0–0.1)
Basophils Relative: 1 %
Eosinophils Absolute: 0.1 10*3/uL (ref 0.0–0.5)
Eosinophils Relative: 1 %
HCT: 41.3 % (ref 36.0–46.0)
Hemoglobin: 14.8 g/dL (ref 12.0–15.0)
Immature Granulocytes: 0 %
Lymphocytes Relative: 44 %
Lymphs Abs: 2.4 10*3/uL (ref 0.7–4.0)
MCH: 31.8 pg (ref 26.0–34.0)
MCHC: 35.8 g/dL (ref 30.0–36.0)
MCV: 88.8 fL (ref 80.0–100.0)
Monocytes Absolute: 0.4 10*3/uL (ref 0.1–1.0)
Monocytes Relative: 7 %
Neutro Abs: 2.5 10*3/uL (ref 1.7–7.7)
Neutrophils Relative %: 47 %
Platelets: 205 10*3/uL (ref 150–400)
RBC: 4.65 MIL/uL (ref 3.87–5.11)
RDW: 12.2 % (ref 11.5–15.5)
WBC: 5.4 10*3/uL (ref 4.0–10.5)
nRBC: 0 % (ref 0.0–0.2)

## 2024-09-05 LAB — COMPREHENSIVE METABOLIC PANEL WITH GFR
ALT: 24 U/L (ref 0–44)
AST: 27 U/L (ref 15–41)
Albumin: 4.5 g/dL (ref 3.5–5.0)
Alkaline Phosphatase: 61 U/L (ref 38–126)
Anion gap: 9 (ref 5–15)
BUN: 13 mg/dL (ref 6–20)
CO2: 28 mmol/L (ref 22–32)
Calcium: 9.8 mg/dL (ref 8.9–10.3)
Chloride: 101 mmol/L (ref 98–111)
Creatinine, Ser: 0.93 mg/dL (ref 0.44–1.00)
GFR, Estimated: >60 mL/min
Glucose, Bld: 80 mg/dL (ref 70–99)
Potassium: 3.9 mmol/L (ref 3.5–5.1)
Sodium: 139 mmol/L (ref 135–145)
Total Bilirubin: 0.7 mg/dL (ref 0.0–1.2)
Total Protein: 7.7 g/dL (ref 6.5–8.1)

## 2024-09-05 LAB — PREGNANCY, URINE: Preg Test, Ur: NEGATIVE

## 2024-09-05 LAB — HCG, SERUM, QUALITATIVE: Preg, Serum: NEGATIVE

## 2024-09-05 NOTE — ED Triage Notes (Addendum)
 Patient reports vaginal bleeding for months, intermittent CP. Denies fevers/urinary symptoms. Patient is alert and oriented x 4. Airway patent, respirations even and unlabored. Skin normal, warm and dry.

## 2024-09-05 NOTE — ED Provider Notes (Signed)
 " [place] EMERGENCY DEPARTMENT AT [facility] Provider Note   CSN: [id number] Arrival date & time: 09/05/24  1620     Patient presents with: Vaginal Bleeding   Carla Banks is a 34 y.o. female with no documented medical history.  Presents to ED complaining of vaginal bleeding as well as chest pain.  She states that her vaginal bleeding has been ongoing since the middle of the month, 1/16.  States her menstrual cycle is irregular.  She reports a history of falling menstrual bleeding.  She reports she is going through maybe 2-3 pads per day, denies any overt hemorrhaging.  She denies lightheadedness, dizziness, weakness, shortness of breath.  She does endorse chest pain which she has been experiencing for months.  She reports that her chest pain was initially waxing and waning and she was unsure of alleviating or aggravating factors.  She denies any shortness of breath that occurs with this.  She reports in the last few days her chest pain has come more constant, more frequent.  She denies any active chest pain.  She denies any shortness of breath that is been occurring with this.  She denies any syncope.  Denies a history of cardiac events.  PERC negative.  Reports that she is here because when she called her OB/GYN earlier and reported her vaginal bleeding, they stated that they would send in birth control to help with this but did give her precautions to include chest pain that if she were to experience she would need to be seen in the ED.   Vaginal Bleeding      Prior to Admission medications  Medication Sig Start Date End Date Taking? Authorizing Provider  fexofenadine -pseudoephedrine (ALLEGRA-D) 60-120 MG 12 hr tablet Take 1 tablet by mouth every 12 (twelve) hours. 05/04/23   Rodriguez-Southworth, Sylvia, PA-C  fluticasone  (FLONASE ) 50 MCG/ACT nasal spray Place 1 spray into both nostrils daily. 05/04/23   Rodriguez-Southworth, Sylvia, PA-C  meclizine  (ANTIVERT ) 12.5 MG  tablet Take 1 tablet (12.5 mg total) by mouth 3 (three) times daily as needed for dizziness. 03/15/19   Wieters, Hallie C, PA-C  ondansetron  (ZOFRAN  ODT) 4 MG disintegrating tablet Take 1 tablet (4 mg total) by mouth every 8 (eight) hours as needed for nausea or vomiting. 03/15/19   Wieters, Hallie C, PA-C  ipratropium (ATROVENT ) 0.06 % nasal spray Place 2 sprays into both nostrils 4 (four) times daily. 06/15/18 03/15/19  Babara Greig GAILS, PA-C    Allergies: Patient has no known allergies.    Review of Systems  Respiratory:  Negative for shortness of breath.   Cardiovascular:  Positive for chest pain.  Genitourinary:  Positive for vaginal bleeding.  All other systems reviewed and are negative.   Updated Vital Signs BP 112/83 (BP Location: Left Arm)   Pulse (!) 51   Temp (!) 97.3 F (36.3 C) (Oral)   Resp 16   Ht 5' 4 (1.626 m)   Wt 65.3 kg   SpO2 100%   BMI 24.71 kg/m   Physical Exam Vitals and nursing note reviewed.  Constitutional:      General: She is not in acute distress.    Appearance: She is well-developed.  HENT:     Head: Normocephalic and atraumatic.  Eyes:     Conjunctiva/sclera: Conjunctivae normal.  Cardiovascular:     Rate and Rhythm: Normal rate and regular rhythm.     Heart sounds: No murmur heard. Pulmonary:     Effort: Pulmonary effort is normal. No  respiratory distress.     Breath sounds: Normal breath sounds.  Abdominal:     Palpations: Abdomen is soft.     Tenderness: There is no abdominal tenderness.  Musculoskeletal:        General: No swelling.     Cervical back: Neck supple.  Skin:    General: Skin is warm and dry.     Capillary Refill: Capillary refill takes less than 2 seconds.  Neurological:     Mental Status: She is alert and oriented to person, place, and time. Mental status is at baseline.  Psychiatric:        Mood and Affect: Mood normal.     (all labs ordered are listed, but only abnormal results are displayed) Labs Reviewed  CBC WITH  DIFFERENTIAL/PLATELET  PREGNANCY, URINE  COMPREHENSIVE METABOLIC PANEL WITH GFR  HCG, SERUM, QUALITATIVE  TROPONIN T, HIGH SENSITIVITY    EKG: EKG Interpretation Date/Time:  Thursday September 05 2024 17:37:25 EST Ventricular Rate:  53 PR Interval:  151 QRS Duration:  82 QT Interval:  453 QTC Calculation: 426 R Axis:   67  Text Interpretation: Sinus rhythm RSR' in V1 or V2, right VCD or RVH Confirmed by Nettie, April ([id number]) on 09/05/2024 5:40:18 PM  Radiology: [facility] Chest 2 View Result Date: 09/05/2024 EXAM: 2 VIEW(S) XRAY OF THE CHEST 09/05/2024 06:21:00 PM COMPARISON: 01/28/2016. CLINICAL HISTORY: Chest pain. FINDINGS: LUNGS AND PLEURA: No focal pulmonary opacity. No pleural effusion. No pneumothorax. HEART AND MEDIASTINUM: No acute abnormality of the cardiac and mediastinal silhouettes. BONES AND SOFT TISSUES: No acute osseous abnormality. IMPRESSION: 1. No acute process. Electronically signed by: Oneil Devonshire MD 09/05/2024 06:50 PM EST RP Workstation: [id number]    Procedures   Medications Ordered in the ED - No data to display   Medical Decision Making Amount and/or Complexity of Data Reviewed Labs: ordered.   This is a pleasant 34 year old female presenting to the ED due to concerns of vaginal bleeding and chest pain.  On exam, HD stable.  Lung sounds are clear bilaterally, no hypoxia.  Abdomen soft and compressible.  Neuroexam at baseline.  Overall nontoxic.  Patient chart reviewed.  Patient was sent in norethindrone earlier by her PCP/OB/GYN which she has not picked up yet.  She arrives complaining of vaginal bleeding which is ongoing for the last 3 weeks as well as chest pain that is ongoing for the last few months.  Patient CBC here is without leukocytosis or anemia.  Hemoglobin is stable.  She reports going through 2-3 pads per day, not hemorrhaging.   Metabolic panel is grossly unremarkable without electrolyte derangement.  hCG is negative.  Troponin is undetectable and  EKG is nonischemic, chest x-ray also unremarkable.  Patient PERC negative.  At this time patient will be discharged home.  Her work appears reassuring.  She was advised to initiate norethindrone sent in by OB/GYN.  She was advised to return to ED if she is going through more than 1 menstrual pad per hour and she voiced understanding.  She had all questions answered to her satisfaction.  Stable to discharge home.    Final diagnoses:  Vaginal bleeding  Precordial pain    ED Discharge Orders     None          Carla Banks 09/06/24 0131    Jerral Meth, MD 09/06/24 [phone]  "

## 2024-09-05 NOTE — ED Provider Notes (Incomplete)
 " [place] EMERGENCY DEPARTMENT AT [facility] Provider Note   CSN: [id number] Arrival date & time: 09/05/24  1620     Patient presents with: Vaginal Bleeding   Carla Banks is a 34 y.o. female with no documented medical history.  Presents to ED complaining of vaginal bleeding as well as chest pain.  She states that her vaginal bleeding has been ongoing since the middle of the month, 1/16.  States her menstrual cycle is irregular.  She reports a history of falling menstrual bleeding.  She reports she is going through maybe 2-3 pads per day, denies any overt hemorrhaging.  She denies lightheadedness, dizziness, weakness, shortness of breath.  She does endorse chest pain which she has been experiencing for months.  She reports that her chest pain was initially waxing and waning and she was unsure of alleviating or aggravating factors.  She denies any shortness of breath that occurs with this.  She reports in the last few days her chest pain has come more constant, more frequent.  She denies any active chest pain.  She denies any shortness of breath that is been occurring with this.  She denies any syncope.  Denies a history of cardiac events.  PERC negative.  Reports that she is here because when she called her OB/GYN earlier and reported her vaginal bleeding, they stated that they would send in birth control to help with this but did give her precautions to include chest pain that if she were to experience she would need to be seen in the ED.   Vaginal Bleeding      Prior to Admission medications  Medication Sig Start Date End Date Taking? Authorizing Provider  fexofenadine -pseudoephedrine (ALLEGRA-D) 60-120 MG 12 hr tablet Take 1 tablet by mouth every 12 (twelve) hours. 05/04/23   Rodriguez-Southworth, Sylvia, PA-C  fluticasone  (FLONASE ) 50 MCG/ACT nasal spray Place 1 spray into both nostrils daily. 05/04/23   Rodriguez-Southworth, Sylvia, PA-C  meclizine  (ANTIVERT ) 12.5 MG  tablet Take 1 tablet (12.5 mg total) by mouth 3 (three) times daily as needed for dizziness. 03/15/19   Wieters, Hallie C, PA-C  ondansetron  (ZOFRAN  ODT) 4 MG disintegrating tablet Take 1 tablet (4 mg total) by mouth every 8 (eight) hours as needed for nausea or vomiting. 03/15/19   Wieters, Hallie C, PA-C  ipratropium (ATROVENT ) 0.06 % nasal spray Place 2 sprays into both nostrils 4 (four) times daily. 06/15/18 03/15/19  Babara Greig GAILS, PA-C    Allergies: Patient has no known allergies.    Review of Systems  Genitourinary:  Positive for vaginal bleeding.    Updated Vital Signs BP (!) 138/105   Pulse (!) 50   Temp (!) 97.4 F (36.3 C) (Oral)   Resp 16   SpO2 100%   Physical Exam  (all labs ordered are listed, but only abnormal results are displayed) Labs Reviewed  CBC WITH DIFFERENTIAL/PLATELET  PREGNANCY, URINE  COMPREHENSIVE METABOLIC PANEL WITH GFR  HCG, SERUM, QUALITATIVE  TROPONIN T, HIGH SENSITIVITY    EKG: EKG Interpretation Date/Time:  Thursday September 05 2024 17:37:25 EST Ventricular Rate:  53 PR Interval:  151 QRS Duration:  82 QT Interval:  453 QTC Calculation: 426 R Axis:   67  Text Interpretation: Sinus rhythm RSR' in V1 or V2, right VCD or RVH Confirmed by Nettie, April ([id number]) on 09/05/2024 5:40:18 PM  Radiology: [facility] Chest 2 View Result Date: 09/05/2024 EXAM: 2 VIEW(S) XRAY OF THE CHEST 09/05/2024 06:21:00 PM COMPARISON: 01/28/2016. CLINICAL HISTORY: Chest pain.  FINDINGS: LUNGS AND PLEURA: No focal pulmonary opacity. No pleural effusion. No pneumothorax. HEART AND MEDIASTINUM: No acute abnormality of the cardiac and mediastinal silhouettes. BONES AND SOFT TISSUES: No acute osseous abnormality. IMPRESSION: 1. No acute process. Electronically signed by: Oneil Devonshire MD 09/05/2024 06:50 PM EST RP Workstation: [id number]    {Document cardiac monitor, telemetry assessment procedure when appropriate:32947} Procedures   Medications Ordered in the ED - No data to  display    {Click here for ABCD2, HEART and other calculators REFRESH Note before signing:1}                              Medical Decision Making Amount and/or Complexity of Data Reviewed Labs: ordered.   ***  {Document critical care time when appropriate  Document review of labs and clinical decision tools ie CHADS2VASC2, etc  Document your independent review of radiology images and any outside records  Document your discussion with family members, caretakers and with consultants  Document social determinants of health affecting pt's care  Document your decision making why or why not admission, treatments were needed:32947:::1}   Final diagnoses:  None    ED Discharge Orders     None        "

## 2024-09-05 NOTE — ED Provider Triage Note (Signed)
 Emergency Medicine Provider Triage Evaluation Note  Carla Banks , a 34 y.o. female  was evaluated in triage.  Pt complains of vaginal bleeding.  Patient reports several months of intermittent vaginal bleeding as well as some radiating chest pain.  She is not currently on any contraceptive and denies any shortness of breath.  Review of Systems  Positive: As above Negative: As above  Physical Exam  BP 123/78 (BP Location: Left Arm)   Pulse 67   Temp 98.5 F (36.9 C)   Resp 16   SpO2 100%  Gen:   Awake, no distress   Resp:  Normal effort  MSK:   Moves extremities without difficulty  Other:  No focal abdominal tenderness.  Medical Decision Making  Medically screening exam initiated at 5:21 PM.  Appropriate orders placed.  Carla Banks was informed that the remainder of the evaluation will be completed by another provider, this initial triage assessment does not replace that evaluation, and the importance of remaining in the ED until their evaluation is complete.     Carla Banks A, PA-C 09/05/24 1722

## 2024-09-06 ENCOUNTER — Encounter (HOSPITAL_COMMUNITY): Payer: Self-pay

## 2024-09-06 ENCOUNTER — Other Ambulatory Visit: Payer: Self-pay

## 2024-09-06 LAB — TROPONIN T, HIGH SENSITIVITY: Troponin T High Sensitivity: <6 ng/L (ref 0–19)

## 2024-09-06 NOTE — ED Notes (Signed)
 Pt axox4. GCS 15. PT and significant other of patient verbalize understanding of discharge instructions and follow up. Pt ambulated out of er with steady gait to transportation home with significant other

## 2024-09-06 NOTE — Discharge Instructions (Signed)
 As discussed, your work appears reassuring.  Please begin taking norethindrone sent in by OB/GYN.  Please return to ED if you develop worsening vaginal bleeding or you are going through more than 1 menstrual pad per hour.  Please follow-up with OB/GYN.  Return to the ED with new symptoms.
